# Patient Record
Sex: Female | Born: 1980 | Race: Black or African American | Hispanic: No | Marital: Married | Smoking: Former smoker
Health system: Southern US, Community
[De-identification: ages and names within clinical notes are randomized; demographics above are authoritative.]

## PROBLEM LIST (undated history)

## (undated) DIAGNOSIS — F419 Anxiety disorder, unspecified: Secondary | ICD-10-CM

## (undated) DIAGNOSIS — N939 Abnormal uterine and vaginal bleeding, unspecified: Secondary | ICD-10-CM

## (undated) DIAGNOSIS — K219 Gastro-esophageal reflux disease without esophagitis: Secondary | ICD-10-CM

## (undated) DIAGNOSIS — E669 Obesity, unspecified: Secondary | ICD-10-CM

## (undated) DIAGNOSIS — E282 Polycystic ovarian syndrome: Secondary | ICD-10-CM

## (undated) HISTORY — DX: Anxiety disorder, unspecified: F41.9

## (undated) HISTORY — PX: DILATION AND CURETTAGE OF UTERUS: SHX78

## (undated) HISTORY — DX: Obesity, unspecified: E66.9

---

## 2007-09-08 ENCOUNTER — Emergency Department (HOSPITAL_COMMUNITY): Admission: EM | Admit: 2007-09-08 | Discharge: 2007-09-08 | Payer: Self-pay | Admitting: Emergency Medicine

## 2010-01-16 ENCOUNTER — Ambulatory Visit (HOSPITAL_COMMUNITY): Admission: RE | Admit: 2010-01-16 | Discharge: 2010-01-16 | Payer: Self-pay | Admitting: Obstetrics & Gynecology

## 2010-09-22 LAB — ELECTROLYTE PANEL
Potassium: 3.3 mEq/L — ABNORMAL LOW (ref 3.5–5.1)
Sodium: 139 mEq/L (ref 135–145)

## 2010-09-22 LAB — CBC
HCT: 32.4 % — ABNORMAL LOW (ref 36.0–46.0)
MCHC: 31.2 g/dL (ref 30.0–36.0)
Platelets: 380 10*3/uL (ref 150–400)
RDW: 15.4 % (ref 11.5–15.5)
WBC: 8.3 10*3/uL (ref 4.0–10.5)

## 2010-09-22 LAB — PREGNANCY, URINE: Preg Test, Ur: NEGATIVE

## 2014-10-13 ENCOUNTER — Other Ambulatory Visit: Payer: Self-pay | Admitting: Obstetrics and Gynecology

## 2014-10-16 LAB — CYTOLOGY - PAP

## 2015-11-08 ENCOUNTER — Emergency Department (HOSPITAL_COMMUNITY): Payer: BLUE CROSS/BLUE SHIELD

## 2015-11-08 ENCOUNTER — Emergency Department (HOSPITAL_COMMUNITY)
Admission: EM | Admit: 2015-11-08 | Discharge: 2015-11-08 | Disposition: A | Discharge status: Home / Self Care | Payer: BLUE CROSS/BLUE SHIELD | Attending: Emergency Medicine | Admitting: Emergency Medicine

## 2015-11-08 ENCOUNTER — Encounter (HOSPITAL_COMMUNITY): Payer: Self-pay

## 2015-11-08 DIAGNOSIS — R079 Chest pain, unspecified: Secondary | ICD-10-CM | POA: Diagnosis present

## 2015-11-08 DIAGNOSIS — Z79899 Other long term (current) drug therapy: Secondary | ICD-10-CM | POA: Insufficient documentation

## 2015-11-08 DIAGNOSIS — R0781 Pleurodynia: Secondary | ICD-10-CM

## 2015-11-08 DIAGNOSIS — Z7984 Long term (current) use of oral hypoglycemic drugs: Secondary | ICD-10-CM | POA: Diagnosis not present

## 2015-11-08 LAB — I-STAT CHEM 8, ED
BUN: 16 mg/dL (ref 6–20)
CALCIUM ION: 1.2 mmol/L (ref 1.12–1.23)
CHLORIDE: 102 mmol/L (ref 101–111)
Creatinine, Ser: 0.7 mg/dL (ref 0.44–1.00)
Glucose, Bld: 81 mg/dL (ref 65–99)
HCT: 39 % (ref 36.0–46.0)
HEMOGLOBIN: 13.3 g/dL (ref 12.0–15.0)
Potassium: 3.6 mmol/L (ref 3.5–5.1)
SODIUM: 142 mmol/L (ref 135–145)
TCO2: 27 mmol/L (ref 0–100)

## 2015-11-08 MED ORDER — IBUPROFEN 400 MG PO TABS: 400.0000 mg | ORAL_TABLET | Freq: Three times a day (TID) | ORAL | Status: AC

## 2015-11-08 MED ORDER — OXYCODONE-ACETAMINOPHEN 5-325 MG PO TABS
2.0000 | ORAL_TABLET | Freq: Once | ORAL | Status: AC
  Administered 2015-11-08: 2 via ORAL
  Filled 2015-11-08: qty 2

## 2015-11-08 MED ORDER — KETOROLAC TROMETHAMINE 60 MG/2ML IM SOLN
60.0000 mg | Freq: Once | INTRAMUSCULAR | Status: AC
  Administered 2015-11-08: 60 mg via INTRAMUSCULAR
  Filled 2015-11-08: qty 2

## 2015-11-08 MED ORDER — OXYCODONE-ACETAMINOPHEN 5-325 MG PO TABS: 1.0000 | ORAL_TABLET | Freq: Three times a day (TID) | ORAL | Status: DC | PRN

## 2015-11-08 NOTE — ED Provider Notes (Signed)
CSN: [id number]     Arrival date & time 11/08/15  1301 History   First MD Initiated Contact with Patient 11/08/15 1309     Chief Complaint  Patient presents with  . Pleurisy     (Consider location/radiation/quality/duration/timing/severity/associated sxs/prior Treatment) Patient is a 35 y.o. female presenting with chest pain.  Chest Pain Pain location:  L chest and R chest Pain quality: sharp   Pain radiates to:  Does not radiate Pain radiates to the back: no   Pain severity:  Mild Onset quality:  Gradual Duration:  5 hours Chronicity:  New Context: breathing   Relieved by:  None tried Worsened by:  Nothing tried Associated symptoms: no cough, no fever and no shortness of breath     History reviewed. No pertinent past medical history. History reviewed. No pertinent past surgical history. No family history on file. Social History  Substance Use Topics  . Smoking status: Never Smoker   . Smokeless tobacco: None  . Alcohol Use: No   OB History    No data available     Review of Systems  Constitutional: Negative for fever and chills.  HENT: Negative for congestion.   Respiratory: Negative for cough and shortness of breath.   Cardiovascular: Positive for chest pain.  Endocrine: Negative for polydipsia and polyuria.  All other systems reviewed and are negative.     Allergies  Hydrocodone; Prednisone; and Rocephin  Home Medications   Prior to Admission medications   Medication Sig Start Date End Date Taking? Authorizing Provider  clomiPHENE (CLOMID) 50 MG tablet Take 50 mg by mouth daily.  10/25/15  Yes Historical Provider, MD  diphenhydramine-acetaminophen (TYLENOL PM) 25-500 MG TABS tablet Take 1 tablet by mouth at bedtime as needed.   Yes Historical Provider, MD  metFORMIN (GLUCOPHAGE-XR) 500 MG 24 hr tablet Take 500 mg by mouth daily with breakfast.  10/25/15  Yes Historical Provider, MD  progesterone (PROMETRIUM) 200 MG capsule Take 200 mg by mouth daily.   10/25/15  Yes Historical Provider, MD  ibuprofen (ADVIL,MOTRIN) 400 MG tablet Take 1 tablet (400 mg total) by mouth 3 (three) times daily. 11/08/15 11/15/15  Marily Memos, MD  oxyCODONE-acetaminophen (PERCOCET) 5-325 MG tablet Take 1 tablet by mouth every 8 (eight) hours as needed for severe pain. 11/08/15   Barbara Cower Arnold Kester, MD   BP 107/61 mmHg  Pulse 64  Temp(Src) 98 F (36.7 C) (Oral)  Resp 18  Ht  (1.676 m)  Wt 215 lb (97.523 kg)  BMI 34.72 kg/m2  SpO2 100%  LMP 11/08/2015 Physical Exam  Constitutional: She is oriented to person, place, and time. She appears well-developed and well-nourished.  HENT:  Head: Normocephalic and atraumatic.  Neck: Normal range of motion.  Cardiovascular: Normal rate and regular rhythm.   Pulmonary/Chest: No stridor. No respiratory distress. She exhibits tenderness (anterior right, middle, parasternal and anterior left ).  Abdominal: She exhibits no distension.  Neurological: She is alert and oriented to person, place, and time. No cranial nerve deficit. Coordination normal.  Skin: Skin is warm and dry. No rash noted. No pallor.  Nursing note and vitals reviewed.   ED Course  Procedures (including critical care time) Labs Review Labs Reviewed  I-STAT CHEM 8, ED    Imaging Review Dg Chest 2 View  11/08/2015  CLINICAL DATA:  Generalized pleuritic chest pain EXAM: CHEST  2 VIEW COMPARISON:  None. FINDINGS: Normal heart size. Normal mediastinal contour. No pneumothorax. No pleural effusion. Lungs appear clear, with no acute  consolidative airspace disease and no pulmonary edema. IMPRESSION: No active cardiopulmonary disease. Electronically Signed   By: Delbert PhenixJason A Poff M.D.   On: 11/08/2015 14:40   I have personally reviewed and evaluated these images and lab results as part of my medical decision-making.   EKG Interpretation   Date/Time:  Thursday Nov 08 2015 13:12:31 EDT Ventricular Rate:  76 PR Interval:  132 QRS Duration: 85 QT Interval:  372 QTC  Calculation: 418 R Axis:   52 Text Interpretation:  Sinus rhythm Confirmed by Linell Meldrum MD, Barbara CowerJASON [phone]([id number])  on 11/08/2015 1:34:37 PM      MDM   Final diagnoses:  Pleuritic pain    Pleuritic pain likely 2/2 muscular ttp. cxr and ecg ok. PERC negative. Had been carrying her babies more per her husband which could be related. Will dc on NSAIDs and will take percocet PRN for severe pain.     Marily MemosJason Cindel Daugherty, MD 11/08/15 2103

## 2015-11-08 NOTE — ED Notes (Signed)
Patient states generalized chest pain with deep breathing. Patient denies NV, and denies radiation of pain.  Pain started this morning and increased PTA.

## 2015-11-08 NOTE — ED Notes (Signed)
Patient verbalizes understanding of discharge instructions, prescriptions, home care and follow up care. Patient out of department at this time. 

## 2016-08-14 LAB — OB RESULTS CONSOLE HEPATITIS B SURFACE ANTIGEN: Hepatitis B Surface Ag: NEGATIVE

## 2016-08-14 LAB — OB RESULTS CONSOLE GC/CHLAMYDIA
CHLAMYDIA, DNA PROBE: NEGATIVE
GC PROBE AMP, GENITAL: NEGATIVE

## 2016-08-14 LAB — OB RESULTS CONSOLE HIV ANTIBODY (ROUTINE TESTING): HIV: NONREACTIVE

## 2016-08-14 LAB — OB RESULTS CONSOLE RUBELLA ANTIBODY, IGM: RUBELLA: IMMUNE

## 2016-08-14 LAB — OB RESULTS CONSOLE RPR: RPR: NONREACTIVE

## 2017-01-10 ENCOUNTER — Inpatient Hospital Stay (HOSPITAL_COMMUNITY)
Admission: AD | Admit: 2017-01-10 | Discharge: 2017-01-10 | Disposition: A | Discharge status: Home / Self Care | Payer: BLUE CROSS/BLUE SHIELD | Source: Ambulatory Visit | Attending: Obstetrics and Gynecology | Admitting: Obstetrics and Gynecology

## 2017-01-10 ENCOUNTER — Encounter (HOSPITAL_COMMUNITY): Payer: Self-pay

## 2017-01-10 DIAGNOSIS — Z3A31 31 weeks gestation of pregnancy: Secondary | ICD-10-CM

## 2017-01-10 DIAGNOSIS — N898 Other specified noninflammatory disorders of vagina: Secondary | ICD-10-CM

## 2017-01-10 DIAGNOSIS — O26893 Other specified pregnancy related conditions, third trimester: Principal | ICD-10-CM

## 2017-01-10 DIAGNOSIS — O42913 Preterm premature rupture of membranes, unspecified as to length of time between rupture and onset of labor, third trimester: Secondary | ICD-10-CM | POA: Diagnosis not present

## 2017-01-10 LAB — URINALYSIS, ROUTINE W REFLEX MICROSCOPIC
BILIRUBIN URINE: NEGATIVE
Glucose, UA: NEGATIVE mg/dL
Hgb urine dipstick: NEGATIVE
KETONES UR: 20 mg/dL — AB
Leukocytes, UA: NEGATIVE
Nitrite: NEGATIVE
PH: 6 (ref 5.0–8.0)
Protein, ur: 30 mg/dL — AB
SPECIFIC GRAVITY, URINE: 1.02 (ref 1.005–1.030)

## 2017-01-10 LAB — POCT FERN TEST: POCT FERN TEST: NEGATIVE

## 2017-01-10 NOTE — MAU Provider Note (Signed)
History     Chief Complaint  Patient presents with  . Rupture of Membranes  36 yo G1P0 MBF FET preg @ 31 1/[redacted] weeks gestation presents with c/o leaking fluid on and off since 6 am. No further sx for several hours now. (+) FM no ctx PNC complicated by shortened cervix. Pt is on prometrium   OB History    Gravida Para Term Preterm AB Living   1             SAB TAB Ectopic Multiple Live Births                  No past medical history on file.  No past surgical history on file.  No family history on file.  Social History  Substance Use Topics  . Smoking status: Never Smoker  . Smokeless tobacco: Not on file  . Alcohol use No    Allergies:  Allergies  Allergen Reactions  . Hydrocodone Hives  . Prednisone Nausea And Vomiting  . Rocephin [Ceftriaxone] Hives    Prescriptions Prior to Admission  Medication Sig Dispense Refill Last Dose  . acetaminophen (TYLENOL) 325 MG tablet Take 325 mg by mouth every 6 (six) hours as needed for mild pain.   Past Week at Unknown time  . calcium carbonate (TUMS - DOSED IN MG ELEMENTAL CALCIUM) 500 MG chewable tablet Chew 1-2 tablets by mouth daily.   Past Week at Unknown time  . Prenatal Vit-Fe Fumarate-FA (PRENATAL MULTIVITAMIN) TABS tablet Take 1 tablet by mouth daily at 12 noon.   01/09/2017 at Unknown time  . progesterone (PROMETRIUM) 200 MG capsule Take 200 mg by mouth daily.    01/09/2017 at Unknown time     Physical Exam   Blood pressure 132/71, pulse (!) 104, temperature 98.4 F (36.9 C), temperature source Oral, resp. rate 20.  General appearance: alert, cooperative and no distress Abdomen: gravid nontender Pelvic: cervix normal in appearance, external genitalia normal and vagina: SSE creamy white d/c . no fluid with coughing. fern neg x 2. cervix closed/firm   Tracing: baseline 130 (+) accel to 150 no ctx or decel ED Course  IMP: vaginal discharge IUP @ 31 1/7 weeks P) d/c home. To call if sx  returns MDM   Quinnten Calvin A, MD 1:10 PM 01/10/2017

## 2017-01-10 NOTE — Progress Notes (Addendum)
G1 @ 31.1 wlksga w/EDD of 9/7. Presents to triage for r/o ROM @ 0700. Clear. U/A and fern test ordered. Noted hgih BP. Serial BP initiated.   Hx of shorten cervix and on bedrest. NO cerclage.   1124: EFM applied.   Fern result: negative  1147: Dr. Cherly Hensenousins paged.  1206: paged Dr. Cherly Hensenousins. Returned page, report status of pt given. Will be in unit to assess pt.   1259: Provider at bs for speculum exam. [facility]. Made aware of high Bps with few normal.   1315: Discharge instructions given with pt understanding. Pt left unit via ambulatory with SO.

## 2017-01-11 ENCOUNTER — Encounter (HOSPITAL_COMMUNITY): Payer: Self-pay | Admitting: Anesthesiology

## 2017-01-11 ENCOUNTER — Encounter (HOSPITAL_COMMUNITY): Payer: Self-pay | Admitting: *Deleted

## 2017-01-11 ENCOUNTER — Inpatient Hospital Stay (HOSPITAL_COMMUNITY)
Admission: AD | Admit: 2017-01-11 | Discharge: 2017-01-29 | DRG: 765 | Disposition: A | Discharge status: Home / Self Care | Payer: BLUE CROSS/BLUE SHIELD | Source: Ambulatory Visit | Attending: Obstetrics and Gynecology | Admitting: Obstetrics and Gynecology

## 2017-01-11 ENCOUNTER — Inpatient Hospital Stay (HOSPITAL_COMMUNITY): Payer: BLUE CROSS/BLUE SHIELD

## 2017-01-11 DIAGNOSIS — Z6841 Body Mass Index (BMI) 40.0 and over, adult: Secondary | ICD-10-CM

## 2017-01-11 DIAGNOSIS — O42913 Preterm premature rupture of membranes, unspecified as to length of time between rupture and onset of labor, third trimester: Principal | ICD-10-CM | POA: Diagnosis present

## 2017-01-11 DIAGNOSIS — O26873 Cervical shortening, third trimester: Secondary | ICD-10-CM | POA: Diagnosis present

## 2017-01-11 DIAGNOSIS — Z87891 Personal history of nicotine dependence: Secondary | ICD-10-CM

## 2017-01-11 DIAGNOSIS — O9962 Diseases of the digestive system complicating childbirth: Secondary | ICD-10-CM | POA: Diagnosis present

## 2017-01-11 DIAGNOSIS — O9081 Anemia of the puerperium: Secondary | ICD-10-CM | POA: Diagnosis not present

## 2017-01-11 DIAGNOSIS — Z3A31 31 weeks gestation of pregnancy: Secondary | ICD-10-CM | POA: Diagnosis not present

## 2017-01-11 DIAGNOSIS — O99824 Streptococcus B carrier state complicating childbirth: Secondary | ICD-10-CM | POA: Diagnosis present

## 2017-01-11 DIAGNOSIS — D62 Acute posthemorrhagic anemia: Secondary | ICD-10-CM | POA: Diagnosis not present

## 2017-01-11 DIAGNOSIS — Z98891 History of uterine scar from previous surgery: Secondary | ICD-10-CM

## 2017-01-11 DIAGNOSIS — K219 Gastro-esophageal reflux disease without esophagitis: Secondary | ICD-10-CM | POA: Diagnosis present

## 2017-01-11 DIAGNOSIS — O4290 Premature rupture of membranes, unspecified as to length of time between rupture and onset of labor, unspecified weeks of gestation: Secondary | ICD-10-CM

## 2017-01-11 DIAGNOSIS — O99214 Obesity complicating childbirth: Secondary | ICD-10-CM | POA: Diagnosis present

## 2017-01-11 DIAGNOSIS — O368390 Maternal care for abnormalities of the fetal heart rate or rhythm, unspecified trimester, not applicable or unspecified: Secondary | ICD-10-CM

## 2017-01-11 HISTORY — DX: Polycystic ovarian syndrome: E28.2

## 2017-01-11 HISTORY — DX: Abnormal uterine and vaginal bleeding, unspecified: N93.9

## 2017-01-11 LAB — URINALYSIS, ROUTINE W REFLEX MICROSCOPIC
BILIRUBIN URINE: NEGATIVE
GLUCOSE, UA: NEGATIVE mg/dL
HGB URINE DIPSTICK: NEGATIVE
Ketones, ur: NEGATIVE mg/dL
Leukocytes, UA: NEGATIVE
Nitrite: NEGATIVE
PH: 6 (ref 5.0–8.0)
Protein, ur: NEGATIVE mg/dL
SPECIFIC GRAVITY, URINE: 1.018 (ref 1.005–1.030)

## 2017-01-11 LAB — TYPE AND SCREEN
ABO/RH(D): O POS
ANTIBODY SCREEN: NEGATIVE

## 2017-01-11 LAB — CBC
HCT: 31.5 % — ABNORMAL LOW (ref 36.0–46.0)
HEMOGLOBIN: 10.1 g/dL — AB (ref 12.0–15.0)
MCH: 22.4 pg — ABNORMAL LOW (ref 26.0–34.0)
MCHC: 32.1 g/dL (ref 30.0–36.0)
MCV: 70 fL — ABNORMAL LOW (ref 78.0–100.0)
Platelets: 274 10*3/uL (ref 150–400)
RBC: 4.5 MIL/uL (ref 3.87–5.11)
RDW: 15.8 % — ABNORMAL HIGH (ref 11.5–15.5)
WBC: 10.4 10*3/uL (ref 4.0–10.5)

## 2017-01-11 LAB — ABO/RH: ABO/RH(D): O POS

## 2017-01-11 LAB — AMNISURE RUPTURE OF MEMBRANE (ROM) NOT AT ARMC: AMNISURE: POSITIVE

## 2017-01-11 MED ORDER — LACTATED RINGERS IV SOLN
INTRAVENOUS | Status: DC
  Administered 2017-01-11: 17:00:00 via INTRAVENOUS

## 2017-01-11 MED ORDER — AMOXICILLIN 500 MG PO CAPS
500.0000 mg | ORAL_CAPSULE | Freq: Three times a day (TID) | ORAL | Status: AC
  Administered 2017-01-13 – 2017-01-18 (×15): 500 mg via ORAL
  Filled 2017-01-11 (×15): qty 1

## 2017-01-11 MED ORDER — CALCIUM CARBONATE ANTACID 500 MG PO CHEW
2.0000 | CHEWABLE_TABLET | ORAL | Status: DC | PRN
  Administered 2017-01-11 – 2017-01-16 (×9): 400 mg via ORAL
  Filled 2017-01-11 (×10): qty 2

## 2017-01-11 MED ORDER — BETAMETHASONE SOD PHOS & ACET 6 (3-3) MG/ML IJ SUSP
12.0000 mg | INTRAMUSCULAR | Status: AC
  Administered 2017-01-11 – 2017-01-12 (×2): 12 mg via INTRAMUSCULAR
  Filled 2017-01-11 (×2): qty 2

## 2017-01-11 MED ORDER — MAGNESIUM SULFATE BOLUS VIA INFUSION
4.0000 g | Freq: Once | INTRAVENOUS | Status: AC
  Administered 2017-01-11: 4 g via INTRAVENOUS
  Filled 2017-01-11: qty 500

## 2017-01-11 MED ORDER — PRENATAL MULTIVITAMIN CH
1.0000 | ORAL_TABLET | Freq: Every day | ORAL | Status: DC
  Administered 2017-01-11 – 2017-01-26 (×16): 1 via ORAL
  Filled 2017-01-11 (×18): qty 1

## 2017-01-11 MED ORDER — AZITHROMYCIN 250 MG PO TABS
500.0000 mg | ORAL_TABLET | Freq: Every day | ORAL | Status: AC
  Administered 2017-01-11 – 2017-01-17 (×7): 500 mg via ORAL
  Filled 2017-01-11 (×9): qty 2

## 2017-01-11 MED ORDER — ZOLPIDEM TARTRATE 5 MG PO TABS
5.0000 mg | ORAL_TABLET | Freq: Every evening | ORAL | Status: DC | PRN
  Administered 2017-01-15 – 2017-01-25 (×11): 5 mg via ORAL
  Filled 2017-01-11 (×11): qty 1

## 2017-01-11 MED ORDER — ACETAMINOPHEN 325 MG PO TABS
650.0000 mg | ORAL_TABLET | ORAL | Status: DC | PRN
  Administered 2017-01-12 – 2017-01-23 (×4): 650 mg via ORAL
  Filled 2017-01-11 (×5): qty 2

## 2017-01-11 MED ORDER — SODIUM CHLORIDE 0.9 % IV SOLN
2.0000 g | Freq: Four times a day (QID) | INTRAVENOUS | Status: AC
  Administered 2017-01-11 – 2017-01-13 (×8): 2 g via INTRAVENOUS
  Filled 2017-01-11 (×8): qty 2000

## 2017-01-11 MED ORDER — MAGNESIUM SULFATE 40 G IN LACTATED RINGERS - SIMPLE
2.0000 g/h | INTRAVENOUS | Status: DC
  Filled 2017-01-11: qty 500

## 2017-01-11 MED ORDER — HYDROXYPROGESTERONE CAPROATE 250 MG/ML IM OIL
250.0000 mg | TOPICAL_OIL | INTRAMUSCULAR | Status: DC
  Administered 2017-01-11 – 2017-01-25 (×3): 250 mg via INTRAMUSCULAR
  Filled 2017-01-11 (×3): qty 1

## 2017-01-11 MED ORDER — DOCUSATE SODIUM 100 MG PO CAPS
100.0000 mg | ORAL_CAPSULE | Freq: Every day | ORAL | Status: DC
  Administered 2017-01-11 – 2017-01-25 (×14): 100 mg via ORAL
  Filled 2017-01-11 (×16): qty 1

## 2017-01-11 NOTE — MAU Note (Signed)
Patient was seen in MAU yesterday for leaking of fluid and sent home after negative tests.  She presents again today with continued leaking and some lower left abdominal pain that comes and goes.  Denies vaginal bleeding.  Reports good fetal movement.

## 2017-01-11 NOTE — H&P (Signed)
Nina Sanchez is a 36 y.o. female presenting @ 31 2/7 weeks with PPROM. Pt was seen yesterday for same complaint and had fern x 2 neg. amni sure done today was positive. PNC complicated by shortened cervix for which the pt was placed on oral Prometrium. Last sono 6/27 3lb 12 oz. (+) FM no ctx OB History    Gravida Para Term Preterm AB Living   1             SAB TAB Ectopic Multiple Live Births                 Past Medical History:  Diagnosis Date  . Abnormal vaginal bleeding   . PCOS (polycystic ovarian syndrome)    Past Surgical History:  Procedure Laterality Date  . DILATION AND CURETTAGE OF UTERUS     Family History: family history is not on file. Social History:  reports that she has quit smoking. Her smoking use included Cigarettes. She has never used smokeless tobacco. She reports that she does not drink alcohol or use drugs.     Maternal Diabetes: No Genetic Screening: Normal Maternal Ultrasounds/Referrals: Normal Fetal Ultrasounds or other Referrals:  Fetal echo nl Maternal Substance Abuse:  No Significant Maternal Medications:  Meds include: Other: prometrium Significant Maternal Lab Results:  None Other Comments:  shortened cervix, FET  ROS neg History   Blood pressure 137/65, pulse (!) 104, temperature 97.9 F (36.6 C), temperature source Oral, resp. rate 18, SpO2 99 %. Exam Physical Exam  Constitutional: She appears well-developed and well-nourished.  HENT:  Head: Atraumatic.  Eyes: EOM are normal.  Neck: Neck supple.  GI:  Gravid soft nontender  Musculoskeletal: She exhibits edema.  Neurological: She is alert.  Skin: Skin is warm and dry.   VE: SSE clear fluid. Cervix visually closed  Digital exam deferred. Prior exam yesterday unchanged Prenatal labs: ABO, Rh:  O positive Antibody:  neg Rubella:  Immune RPR:   NR HBsAg:   neg HIV:   neg GBS:   pending  Assessment/Plan: PPROM IUP@ 31 2/7 week P) admit routine labs. IV  Amp/azithromycin( PPROM antibiotics). Magnesium for neuro prophylaxis, GBS cx sono for AFI. Cont monitor. D/c Prometrium. Start 17 OHP, NICU consult  Nina Sanchez A 01/11/2017, 2:39 PM

## 2017-01-12 LAB — CULTURE, BETA STREP (GROUP B ONLY)

## 2017-01-12 LAB — GLUCOSE, CAPILLARY: Glucose-Capillary: 82 mg/dL (ref 65–99)

## 2017-01-12 LAB — OB RESULTS CONSOLE GBS: STREP GROUP B AG: POSITIVE

## 2017-01-12 MED ORDER — MAGNESIUM SULFATE 40 G IN LACTATED RINGERS - SIMPLE
2.0000 g/h | INTRAVENOUS | Status: DC
  Administered 2017-01-12: 2 g/h via INTRAVENOUS
  Filled 2017-01-12: qty 40

## 2017-01-12 NOTE — Progress Notes (Signed)
HD #2 31 3/7 weeks  S; active fetus Denies any further leakage  O: BP 123/66 (BP Location: Left Arm)   Pulse (!) 111   Temp 97.8 F (36.6 C) (Oral)   Resp 18   Ht 5\' 6"  (1.676 m)   Wt 118.8 kg (262 lb)   SpO2 100%   BMI 42.29 kg/m  Lungs clear to A Cor RRR  abdomen: gravid non tender Pelvic deferred Ext no erythema or exudate  Tracing: baseline 130 (+) accel   No ctx  GBS cx (+)  IMP: PPROM on IV antibiotics( Amp/Azithro).  On Magnesium sulfate for neuro prophylaxis IUP @ 31  3/7 weeks. S/P BMZ #1 GBS cx (+) P) cont present mgmt . Await nicu consult

## 2017-01-12 NOTE — Consult Note (Signed)
Neonatology Consult  Note:  At the request of the patients obstetrician Dr. Garwin Brothers I met with Nina Sanchez who is 31 3 weeks currently with pregnancy complicated by PPROM on 7/8 (at 23 2 weeks) and short cervix.  She received betamethasone 7/8-7/9 and is being managed with  IV antibiotics ( Amp/Azithro) for PPROM and 17 OHP for short cervix.  On Magnesium sulfate for neuro prophylaxis.   We reviewed initial delivery room management, including CPAP, [place], and low but certainly possible need for intubation for surfactant administration.  We discussed feeding immaturity and need for full po intake with multiple days of good weight gain and no apnea or bradycardia before discharge.  We reviewed increased risk of jaundice, infection, and temperature instability.    Discussed likely length of stay.  Thank you for allowing [place] to participate in her care.  Please call with questions.  Higinio Roger, DO  Neonatologist   The total length of face-to-face or floor / unit time for this encounter was 20 minutes.  Counseling and / or coordination of care was greater than fifty percent of the time.

## 2017-01-13 NOTE — Progress Notes (Signed)
Pt called out and said she was leaking more clear fluid. Pt denies pain and reports good fetal movement. Educated pt that she may continue to leak fluids since she is ruptured. Informed her that she needs to call out if she begins to have abdominal or back pain.

## 2017-01-13 NOTE — Progress Notes (Addendum)
HD #3 31 4/7 weeks  S; active fetus (+)leakage today  O: BP (!) 143/71 (BP Location: Right Arm)   Pulse 97   Temp 98.6 F (37 C) (Oral)   Resp 20   Ht 5\' 6"  (1.676 m)   Wt 120.4 kg (265 lb 8 oz)   SpO2 100%   BMI 42.85 kg/m    Lungs clear to A Cor RRR  abdomen: gravid non tender Pelvic deferred Ext no erythema or exudate  Tracing: baseline 130 (+) accel  140-145 No ctx   IMP: PPROM completed IV antibiotics( Amp/Azithro) started on amoxyl and azithromycin orally.  Magnesium sulfate for neuro prophylaxis completed IUP @ 31  4/7 weeks. S/P BMZ #2 GBS cx (+) P) cont inpt mgmt. Monitor( NSt q shift

## 2017-01-14 LAB — TYPE AND SCREEN
ABO/RH(D): O POS
ANTIBODY SCREEN: NEGATIVE

## 2017-01-14 NOTE — Progress Notes (Signed)
HD #4 31 5/7 weeks  S; still leaking. No ctx (+) FM Did nicu tour yesterday. No questions  O: BP 134/70 (BP Location: Right Arm)   Pulse 97   Temp 98.3 F (36.8 C) (Oral)   Resp 18   Ht 5\' 6"  (1.676 m)   Wt 119.9 kg (264 lb 4 oz)   SpO2 99%   BMI 42.65 kg/m     Lungs clear to A Cor RRR  abdomen: gravid non tender Pelvic deferred Ext no erythema or exudate  Tracing: baseline 130 (+) accel  140-145 No ctx   IMP: PPROM completed IV antibiotics( Amp/Azithro) started on amoxyl and azithromycin orally D2.  Magnesium sulfate for neuro prophylaxis completed IUP @ 31  5/7 weeks.  BMZ complete GBS cx (+) P) cont inpt mgmt.

## 2017-01-15 ENCOUNTER — Encounter (HOSPITAL_COMMUNITY): Payer: Self-pay

## 2017-01-15 NOTE — Progress Notes (Signed)
HD #5 31 6/7 weeks  S; no leaking fluid today. No ctx (+) FM   O: BP 128/63 (BP Location: Left Arm)   Pulse 95   Temp 98.7 F (37.1 C) (Oral)   Resp 18   Ht 5\' 6"  (1.676 m)   Wt 117.3 kg (258 lb 8 oz)   SpO2 98%   BMI 41.72 kg/m      Lungs clear to A Cor RRR  abdomen: gravid non tender Pelvic deferred Ext no erythema or exudate  Tracing: baseline 135 (+) accel 160 No ctx   IMP: PPROM  on amoxyl and azithromycin orally D3.   S/P Magnesium sulfate, BMZ  IUP @ 31  6/7 weeks.  GBS cx (+) P) cont inpt mgmt.

## 2017-01-15 NOTE — Progress Notes (Signed)
Family accompanied pt outside.

## 2017-01-16 NOTE — Progress Notes (Signed)
HD #6 32 weeks  S; no leaking fluid today. No ctx (+) FM   O: VSS afebrile Lungs clear to A Cor RRR  abdomen: gravid non tender Pelvic deferred Ext no erythema or exudate   IMP: PPROM  on amoxyl and azithromycin orally D4.   S/P Magnesium sulfate, BMZ  IUP @ 32 weeks.  GBS cx (+) P) cont inpt mgmt.

## 2017-01-17 LAB — TYPE AND SCREEN
ABO/RH(D): O POS
Antibody Screen: NEGATIVE

## 2017-01-17 MED ORDER — PANTOPRAZOLE SODIUM 40 MG PO TBEC
40.0000 mg | DELAYED_RELEASE_TABLET | Freq: Every day | ORAL | Status: DC
  Administered 2017-01-17 – 2017-01-18 (×3): 40 mg via ORAL
  Filled 2017-01-17 (×3): qty 1

## 2017-01-17 NOTE — Progress Notes (Signed)
HD #7 32 1/7 weeks  S;  No ctx (+) FM. Request med for heartburn as tums not working. Clear AF   O: BP (!) 156/75   Pulse 97   Temp 98 F (36.7 C) (Oral)   Resp 18   Ht 5\' 6"  (1.676 m)   Wt 115.2 kg (254 lb 0.6 oz)   SpO2 100%   BMI 41.00 kg/m   Lungs clear to A Cor RRR  abdomen: gravid non tender Pelvic deferred Ext no erythema or tenderness  Tracing: baseline 130 (+) accel to 160 No ctx   IMP: PPROM  on amoxyl and azithromycin orally D5.   S/P Magnesium sulfate, BMZ  IUP @ 32 1/7 GBS cx (+) P) cont inpt mgmt., change to daily NST. Start protonix

## 2017-01-18 NOTE — Progress Notes (Addendum)
HD #8 32 2/7 weeks  S;  No complaints No leakage. (+) FM  O: BP 125/64 (BP Location: Right Arm)   Pulse (!) 102   Temp 98.3 F (36.8 C) (Oral)   Resp 16   Ht 5\' 6"  (1.676 m)   Wt 115.4 kg (254 lb 8 oz)   SpO2 100%   BMI 41.08 kg/m    Lungs clear to A Cor RRR  abdomen: gravid non tender Pelvic deferred Ext no erythema or tenderness  Tracing: baseline 130 (+) accel to 160 No ctx   IMP: PPROM  S/P Magnesium sulfate, BMZ . PPROM Antibiotics complete. No evidence of labor or chorio IUP @ 32 2/7 GBS cx (+) GERD on protonix P) cont inpt mgmt

## 2017-01-19 MED ORDER — PANTOPRAZOLE SODIUM 40 MG PO TBEC
40.0000 mg | DELAYED_RELEASE_TABLET | Freq: Two times a day (BID) | ORAL | Status: DC
  Administered 2017-01-19 – 2017-01-26 (×15): 40 mg via ORAL
  Filled 2017-01-19 (×15): qty 1

## 2017-01-19 NOTE — Progress Notes (Addendum)
HD #9 32 3/7 weeks  S;  No complaints No leakage. (+) FM ? If another sono for weight  O: BP 128/64 P100 T 98.7    Lungs clear to A Cor RRR  abdomen: gravid non tender Pelvic deferred Ext no erythema or tenderness     IMP: PPROM  S/P Magnesium sulfate, BMZ . PPROM Antibiotics complete. No evidence of labor or chorio IUP @ 32 3/7 GBS cx (+) GERD on protonix( increase to bid). No indication for sono for growth( last one done 6/27) P) cont inpt mgmt

## 2017-01-19 NOTE — Progress Notes (Signed)
CSW met with patient in room 309 at the request of patient.  CSW inquired about patient's needs and patient requested information regarding adoption process (patient wants to adopt a baby).  CSW provided patient with information regarding becoming an adopting parent and provided patient with local adoption agencies. Patient was receptive of information and thanked CSW for meeting with patient.   Laurey Arrow, MSW, LCSW Clinical Social Work [phone]

## 2017-01-19 NOTE — Progress Notes (Signed)
Initial Nutrition Assessment  DOCUMENTATION CODES:  Obesity unspecified  INTERVENTION:  Regular diet May order double protein portions, snacks TID and from retail  NUTRITION DIAGNOSIS:  Increased nutrient needs related to  (pregnancy and fetal growth rerquirements) as evidenced by  (32 weeks IUP).  GOAL:  Patient will meet greater than or equal to 90% of their needs MONITOR:  Weight trends  REASON FOR ASSESSMENT:  Antenatal   ASSESSMENT:  32 3/7 weeks, PROM. pregravid wt 250 lbs, weight gain 6 lbs. Appetite is excellent. Delivery planned at 34 weeks per pt   Diet Order:  Diet regular Room service appropriate? Yes; Fluid consistency: Thin Skin:  Reviewed, no issues  Height:   Ht Readings from Last 1 Encounters:  01/11/17 5\' 6"  (1.676 m)   Weight:   Wt Readings from Last 1 Encounters:  01/19/17 256 lb 12 oz (116.5 kg)    Ideal Body Weight:    130 lbs  BMI:  Body mass index is 41.44 kg/m.  Estimated Nutritional Needs:   Kcal:  2200-2400  Protein:  90-100 g  Fluid:  2.5 L  EDUCATION NEEDS:   No education needs identified at this time  Nina Sanchez M.Odis LusterEd. R.D. LDN Neonatal Nutrition Support Specialist/RD III Pager [phone]      Phone [phone]

## 2017-01-20 NOTE — Progress Notes (Addendum)
HD #10 32 4/7 weeks  S;  No complaints Active fetus./ denies ctx  O: BP 125/65 (BP Location: Left Arm)   Pulse 99   Temp 98.7 F (37.1 C) (Oral)   Resp 20   Ht 5\' 6"  (1.676 m)   Wt 117.4 kg (258 lb 12 oz)   SpO2 100%   BMI 41.76 kg/m    Lungs clear to A Cor RRR  abdomen: gravid non tender Pelvic deferred Ext no erythema or tenderness  IMP: PPROM  S/P Magnesium sulfate, BMZ . PPROM Antibiotics complete. No evidence of labor or chorio IUP @ 32 4/7 GBS cx (+) GERD controlled P) IOL 34 weeks. Ok for weekly out of room visit. Seen by SW

## 2017-01-21 NOTE — Progress Notes (Addendum)
HD #11 32 5/7 weeks  S;  No complaints Active fetus./ denies ctx leaking clear fluid without color odor  O: BP (!) 120/59 (BP Location: Left Arm)   Pulse (!) 103   Temp 99.1 F (37.3 C) (Oral)   Resp 20   Ht 5\' 6"  (1.676 m)   Wt 117.4 kg (258 lb 12 oz)   SpO2 98%   BMI 41.76 kg/m    Lungs clear to A Cor RRR  abdomen: gravid non tender Pelvic deferred Ext no erythema or tenderness Tracing: baseline 135 (+) accel to 155 no variables/decels or ctx  IMP: PPROM  S/P Magnesium sulfate, BMZ . PPROM Antibiotics complete. No evidence of labor or chorio IUP @ 32 5/7 GBS cx positive GERD controlled P)remain inpt

## 2017-01-21 NOTE — Progress Notes (Signed)
Received call from Cousins MD. MD called to notify nurse that per protocol T and S will not be signed off and will not be completed on this pt as [facility]

## 2017-01-22 NOTE — Progress Notes (Signed)
HD #12  32 6/7 weeks  S;  Had difficulty sleeping. (+) leaking fluid (+) FM. Denies ctx  O: BP 108/68 (BP Location: Right Arm)   Pulse (!) 103   Temp (!) 97.5 F (36.4 C) (Oral) Comment: eating ice  Resp 16   Ht 5\' 6"  (1.676 m)   Wt 117.4 kg (258 lb 12 oz)   SpO2 98%   BMI 41.76 kg/m   Lungs clear to A Cor RRR  abdomen: gravid non tender Pelvic deferred Ext +1 edema   IMP: PPROM  S/P Magnesium sulfate, BMZ . PPROM Antibiotics complete. No evidence of labor or chorio IUP @ 32 6/7 GBS cx positive GERD controlled  P)nst today PTL prec. IOL @ 34 weeks

## 2017-01-23 MED ORDER — BUTALBITAL-APAP-CAFFEINE 50-325-40 MG PO TABS
1.0000 | ORAL_TABLET | Freq: Once | ORAL | Status: AC
  Administered 2017-01-23: 1 via ORAL
  Filled 2017-01-23: qty 1

## 2017-01-23 NOTE — Progress Notes (Addendum)
HD #13 33 weeks  S;  Still leaking fluid (+) active fetus O: BP (!) 113/52   Pulse (!) 101   Temp 98.2 F (36.8 C) (Oral)   Resp 18   Ht 5\' 6"  (1.676 m)   Wt 117.4 kg (258 lb 12 oz)   SpO2 99%   BMI 41.76 kg/m    Lungs clear to A Cor RRR  abdomen: gravid non tender Pelvic deferred Ext +1 edema Tracing: baseline 140 (+) accel occ short variable  IMP: PPROM  S/P Magnesium sulfate, BMZ . PPROM Antibiotics complete. No evidence of labor or chorio IUP @ 33 0/7 GBS cx positive GERD controlled  P). IOL @ 34 weeks. Remain inpt. Defer T&S until time for delivery

## 2017-01-24 MED ORDER — OXYCODONE-ACETAMINOPHEN 5-325 MG PO TABS
1.0000 | ORAL_TABLET | Freq: Four times a day (QID) | ORAL | Status: DC | PRN
  Administered 2017-01-24 (×2): 2 via ORAL
  Administered 2017-01-25 (×2): 1 via ORAL
  Filled 2017-01-24: qty 1
  Filled 2017-01-24 (×2): qty 2
  Filled 2017-01-24: qty 1

## 2017-01-24 NOTE — Progress Notes (Signed)
Pt requested to be placed on EFM due to having more leaking.

## 2017-01-24 NOTE — Progress Notes (Addendum)
HD #14 33 1/7 weeks  S;  Still leaking fluid (+) active fetus has headache not responsive to Fioricet last night. Denies visual changes or light sensitivity O: BP 123/69 (BP Location: Right Arm)   Pulse 94   Temp 98.2 F (36.8 C) (Oral)   Resp 18   Ht 5\' 6"  (1.676 m)   Wt 117.4 kg (258 lb 12 oz)   SpO2 99%   BMI 41.76 kg/m    Lungs clear to A Cor RRR  abdomen: gravid non tender Pelvic deferred Ext +1 edema Tracing: baseline 140 (+) accel occ short variable  IMP: PPROM  S/P Magnesium sulfate, BMZ . PPROM Antibiotics complete. No evidence of labor or chorio IUP @ 33 1/7 GBS cx positive GERD controlled  P). IOL @ 34 weeks. Remain inpt. Defer T&S until time for delivery

## 2017-01-25 LAB — CBC WITH DIFFERENTIAL/PLATELET
Basophils Absolute: 0 10*3/uL (ref 0.0–0.1)
Basophils Relative: 0 %
EOS ABS: 0.3 10*3/uL (ref 0.0–0.7)
EOS PCT: 3 %
HEMATOCRIT: 33.2 % — AB (ref 36.0–46.0)
Hemoglobin: 10.8 g/dL — ABNORMAL LOW (ref 12.0–15.0)
LYMPHS ABS: 2.8 10*3/uL (ref 0.7–4.0)
LYMPHS PCT: 23 %
MCH: 22.7 pg — AB (ref 26.0–34.0)
MCHC: 32.5 g/dL (ref 30.0–36.0)
MCV: 69.9 fL — AB (ref 78.0–100.0)
MONO ABS: 0.3 10*3/uL (ref 0.1–1.0)
MONOS PCT: 3 %
Neutro Abs: 8.6 10*3/uL — ABNORMAL HIGH (ref 1.7–7.7)
Neutrophils Relative %: 71 %
PLATELETS: 284 10*3/uL (ref 150–400)
RBC: 4.75 MIL/uL (ref 3.87–5.11)
RDW: 15.9 % — AB (ref 11.5–15.5)
WBC: 12.1 10*3/uL — AB (ref 4.0–10.5)

## 2017-01-25 LAB — COMPREHENSIVE METABOLIC PANEL
ALBUMIN: 2.8 g/dL — AB (ref 3.5–5.0)
ALT: 17 U/L (ref 14–54)
AST: 20 U/L (ref 15–41)
Alkaline Phosphatase: 75 U/L (ref 38–126)
Anion gap: 8 (ref 5–15)
BILIRUBIN TOTAL: 0.1 mg/dL — AB (ref 0.3–1.2)
BUN: 7 mg/dL (ref 6–20)
CHLORIDE: 104 mmol/L (ref 101–111)
CO2: 23 mmol/L (ref 22–32)
CREATININE: 0.53 mg/dL (ref 0.44–1.00)
Calcium: 8.9 mg/dL (ref 8.9–10.3)
GFR calc Af Amer: >60 mL/min (ref >60)
GFR calc non Af Amer: >60 mL/min (ref >60)
GLUCOSE: 89 mg/dL (ref 65–99)
POTASSIUM: 3.4 mmol/L — AB (ref 3.5–5.1)
Sodium: 135 mmol/L (ref 135–145)
TOTAL PROTEIN: 7.1 g/dL (ref 6.5–8.1)

## 2017-01-25 LAB — PROTEIN / CREATININE RATIO, URINE
Creatinine, Urine: 67 mg/dL
Protein Creatinine Ratio: 0.76 mg/mg{Cre} — ABNORMAL HIGH (ref 0.00–0.15)
TOTAL PROTEIN, URINE: 51 mg/dL

## 2017-01-25 LAB — URIC ACID: Uric Acid, Serum: 4 mg/dL (ref 2.3–6.6)

## 2017-01-25 NOTE — Progress Notes (Signed)
HD #15 33 2/7 weeks  S;   leaking fluid  A lot (+) active fetus notes h/a despite percocet. Denies heartburn or visual changes O: BP 108/73 (BP Location: Right Arm)   Pulse 95   Temp 98 F (36.7 C) (Oral)   Resp 20   Ht 5\' 6"  (1.676 m)   Wt 117.4 kg (258 lb 12 oz)   SpO2 98%   BMI 41.76 kg/m     Breast soft nontender abdomen: gravid non tender Pelvic deferred Ext +1 edema Tracing: baseline 125 (+) accels no ctx  IMP: PPROM  S/P Magnesium sulfate, BMZ . PPROM Antibiotics complete. No evidence of labor or chorio Persistent h/a IUP @ 33 2/7 GBS cx positive GERD controlled with protonix  P). PIH labs today r/o atypical preeclampsia. Weight. inpt status

## 2017-01-26 ENCOUNTER — Inpatient Hospital Stay (HOSPITAL_COMMUNITY): Payer: BLUE CROSS/BLUE SHIELD | Admitting: Anesthesiology

## 2017-01-26 ENCOUNTER — Encounter (HOSPITAL_COMMUNITY): Payer: Self-pay | Admitting: *Deleted

## 2017-01-26 ENCOUNTER — Inpatient Hospital Stay (HOSPITAL_COMMUNITY): Payer: BLUE CROSS/BLUE SHIELD

## 2017-01-26 ENCOUNTER — Encounter (HOSPITAL_COMMUNITY)
Admission: AD | Disposition: A | Discharge status: Home / Self Care | Payer: Self-pay | Source: Ambulatory Visit | Attending: Obstetrics and Gynecology

## 2017-01-26 DIAGNOSIS — Z98891 History of uterine scar from previous surgery: Secondary | ICD-10-CM

## 2017-01-26 LAB — TYPE AND SCREEN
ABO/RH(D): O POS
Antibody Screen: NEGATIVE

## 2017-01-26 SURGERY — Surgical Case
Anesthesia: Spinal

## 2017-01-26 MED ORDER — SODIUM CHLORIDE 0.9 % IR SOLN
Status: DC | PRN
  Administered 2017-01-26: 1

## 2017-01-26 MED ORDER — OXYCODONE HCL 5 MG PO TABS: 5.0000 mg | ORAL_TABLET | Freq: Once | ORAL | Status: DC | PRN

## 2017-01-26 MED ORDER — MENTHOL 3 MG MT LOZG: 1.0000 | LOZENGE | OROMUCOSAL | Status: DC | PRN

## 2017-01-26 MED ORDER — CEFAZOLIN SODIUM-DEXTROSE 2-3 GM-% IV SOLR
INTRAVENOUS | Status: DC | PRN
  Administered 2017-01-26: 2 g via INTRAVENOUS

## 2017-01-26 MED ORDER — FENTANYL CITRATE (PF) 100 MCG/2ML IJ SOLN
INTRAMUSCULAR | Status: DC | PRN
  Administered 2017-01-26: 20 ug via INTRATHECAL

## 2017-01-26 MED ORDER — SIMETHICONE 80 MG PO CHEW: 80.0000 mg | CHEWABLE_TABLET | ORAL | Status: DC | PRN

## 2017-01-26 MED ORDER — DEXAMETHASONE SODIUM PHOSPHATE 4 MG/ML IJ SOLN
INTRAMUSCULAR | Status: DC | PRN
  Administered 2017-01-26: 4 mg via INTRAVENOUS

## 2017-01-26 MED ORDER — ONDANSETRON HCL 4 MG/2ML IJ SOLN: 4.0000 mg | Freq: Four times a day (QID) | INTRAMUSCULAR | Status: DC | PRN

## 2017-01-26 MED ORDER — NALBUPHINE SYRINGE 5 MG/0.5 ML
5.0000 mg | INJECTION | INTRAMUSCULAR | Status: DC | PRN
  Administered 2017-01-27: 5 mg via SUBCUTANEOUS
  Filled 2017-01-26 (×3): qty 0.5

## 2017-01-26 MED ORDER — BUPIVACAINE HCL (PF) 0.25 % IJ SOLN
INTRAMUSCULAR | Status: AC
  Filled 2017-01-26: qty 30

## 2017-01-26 MED ORDER — ONDANSETRON HCL 4 MG/2ML IJ SOLN: 4.0000 mg | Freq: Three times a day (TID) | INTRAMUSCULAR | Status: DC | PRN

## 2017-01-26 MED ORDER — SOD CITRATE-CITRIC ACID 500-334 MG/5ML PO SOLN
ORAL | Status: AC
  Filled 2017-01-26: qty 15

## 2017-01-26 MED ORDER — CEFAZOLIN SODIUM-DEXTROSE 2-4 GM/100ML-% IV SOLN
INTRAVENOUS | Status: AC
  Filled 2017-01-26: qty 100

## 2017-01-26 MED ORDER — SIMETHICONE 80 MG PO CHEW
80.0000 mg | CHEWABLE_TABLET | ORAL | Status: DC
  Administered 2017-01-27 – 2017-01-29 (×3): 80 mg via ORAL
  Filled 2017-01-26 (×3): qty 1

## 2017-01-26 MED ORDER — MEPERIDINE HCL 25 MG/ML IJ SOLN: 6.2500 mg | INTRAMUSCULAR | Status: DC | PRN

## 2017-01-26 MED ORDER — NALBUPHINE SYRINGE 5 MG/0.5 ML
5.0000 mg | INJECTION | INTRAMUSCULAR | Status: DC | PRN
  Filled 2017-01-26: qty 0.5

## 2017-01-26 MED ORDER — NALOXONE HCL 0.4 MG/ML IJ SOLN: 0.4000 mg | INTRAMUSCULAR | Status: DC | PRN

## 2017-01-26 MED ORDER — NALBUPHINE SYRINGE 5 MG/0.5 ML
5.0000 mg | INJECTION | Freq: Once | INTRAMUSCULAR | Status: DC | PRN
  Filled 2017-01-26: qty 0.5

## 2017-01-26 MED ORDER — MORPHINE SULFATE (PF) 0.5 MG/ML IJ SOLN
INTRAMUSCULAR | Status: AC
  Filled 2017-01-26: qty 10

## 2017-01-26 MED ORDER — LACTATED RINGERS IV SOLN: INTRAVENOUS | Status: DC

## 2017-01-26 MED ORDER — OXYCODONE HCL 5 MG PO TABS: 5.0000 mg | ORAL_TABLET | ORAL | Status: DC | PRN

## 2017-01-26 MED ORDER — OXYTOCIN 40 UNITS IN LACTATED RINGERS INFUSION - SIMPLE MED: 2.5000 [IU]/h | INTRAVENOUS | Status: DC

## 2017-01-26 MED ORDER — LACTATED RINGERS IV SOLN
INTRAVENOUS | Status: DC | PRN
  Administered 2017-01-26 (×2): via INTRAVENOUS

## 2017-01-26 MED ORDER — NALOXONE HCL 2 MG/2ML IJ SOSY: 1.0000 ug/kg/h | PREFILLED_SYRINGE | INTRAVENOUS | Status: DC | PRN

## 2017-01-26 MED ORDER — ONDANSETRON HCL 4 MG/2ML IJ SOLN
INTRAMUSCULAR | Status: AC
  Filled 2017-01-26: qty 2

## 2017-01-26 MED ORDER — SENNOSIDES-DOCUSATE SODIUM 8.6-50 MG PO TABS
2.0000 | ORAL_TABLET | ORAL | Status: DC
  Administered 2017-01-27 – 2017-01-29 (×3): 2 via ORAL
  Filled 2017-01-26 (×3): qty 2

## 2017-01-26 MED ORDER — BUPIVACAINE IN DEXTROSE 0.75-8.25 % IT SOLN
INTRATHECAL | Status: DC | PRN
  Administered 2017-01-26: 1.6 mL via INTRATHECAL

## 2017-01-26 MED ORDER — PHENYLEPHRINE 8 MG IN D5W 100 ML (0.08MG/ML) PREMIX OPTIME
INJECTION | INTRAVENOUS | Status: AC
  Filled 2017-01-26: qty 100

## 2017-01-26 MED ORDER — MORPHINE SULFATE (PF) 0.5 MG/ML IJ SOLN
INTRAMUSCULAR | Status: DC | PRN
Start: 2017-01-26 — End: 2017-01-26
  Administered 2017-01-26: .2 mg via INTRATHECAL

## 2017-01-26 MED ORDER — KETOROLAC TROMETHAMINE 30 MG/ML IJ SOLN
30.0000 mg | Freq: Four times a day (QID) | INTRAMUSCULAR | Status: AC | PRN
  Administered 2017-01-26: 30 mg via INTRAMUSCULAR

## 2017-01-26 MED ORDER — KETOROLAC TROMETHAMINE 30 MG/ML IJ SOLN
30.0000 mg | Freq: Four times a day (QID) | INTRAMUSCULAR | Status: AC | PRN
  Administered 2017-01-27: 30 mg via INTRAVENOUS
  Filled 2017-01-26: qty 1

## 2017-01-26 MED ORDER — OXYTOCIN 10 UNIT/ML IJ SOLN
INTRAMUSCULAR | Status: AC
  Filled 2017-01-26: qty 4

## 2017-01-26 MED ORDER — OXYTOCIN 10 UNIT/ML IJ SOLN
INTRAMUSCULAR | Status: DC | PRN
  Administered 2017-01-26: 40 [IU] via INTRAVENOUS

## 2017-01-26 MED ORDER — TETANUS-DIPHTH-ACELL PERTUSSIS 5-2.5-18.5 LF-MCG/0.5 IM SUSP: 0.5000 mL | Freq: Once | INTRAMUSCULAR | Status: DC

## 2017-01-26 MED ORDER — COCONUT OIL OIL: 1.0000 "application " | TOPICAL_OIL | Status: DC | PRN

## 2017-01-26 MED ORDER — PHENYLEPHRINE 8 MG IN D5W 100 ML (0.08MG/ML) PREMIX OPTIME
INJECTION | INTRAVENOUS | Status: DC | PRN
  Administered 2017-01-26: 60 ug/min via INTRAVENOUS

## 2017-01-26 MED ORDER — BUPIVACAINE HCL (PF) 0.25 % IJ SOLN
INTRAMUSCULAR | Status: DC | PRN
  Administered 2017-01-26: 10 mL

## 2017-01-26 MED ORDER — FENTANYL CITRATE (PF) 100 MCG/2ML IJ SOLN: 25.0000 ug | INTRAMUSCULAR | Status: DC | PRN

## 2017-01-26 MED ORDER — PRENATAL MULTIVITAMIN CH
1.0000 | ORAL_TABLET | Freq: Every day | ORAL | Status: DC
  Administered 2017-01-27 – 2017-01-29 (×3): 1 via ORAL
  Filled 2017-01-26 (×3): qty 1

## 2017-01-26 MED ORDER — LACTATED RINGERS IV BOLUS (SEPSIS)
1000.0000 mL | Freq: Once | INTRAVENOUS | Status: AC
  Administered 2017-01-26: 1000 mL via INTRAVENOUS

## 2017-01-26 MED ORDER — KETOROLAC TROMETHAMINE 30 MG/ML IJ SOLN
INTRAMUSCULAR | Status: AC
  Filled 2017-01-26: qty 1

## 2017-01-26 MED ORDER — SCOPOLAMINE 1 MG/3DAYS TD PT72: 1.0000 | MEDICATED_PATCH | Freq: Once | TRANSDERMAL | Status: DC

## 2017-01-26 MED ORDER — FENTANYL CITRATE (PF) 100 MCG/2ML IJ SOLN
INTRAMUSCULAR | Status: AC
  Filled 2017-01-26: qty 2

## 2017-01-26 MED ORDER — ZOLPIDEM TARTRATE 5 MG PO TABS
5.0000 mg | ORAL_TABLET | Freq: Every evening | ORAL | Status: DC | PRN
  Administered 2017-01-27 – 2017-01-29 (×3): 5 mg via ORAL
  Filled 2017-01-26 (×3): qty 1

## 2017-01-26 MED ORDER — DIPHENHYDRAMINE HCL 25 MG PO CAPS
25.0000 mg | ORAL_CAPSULE | Freq: Four times a day (QID) | ORAL | Status: DC | PRN
  Administered 2017-01-27 (×2): 25 mg via ORAL
  Filled 2017-01-26 (×2): qty 1

## 2017-01-26 MED ORDER — DIPHENHYDRAMINE HCL 25 MG PO CAPS
25.0000 mg | ORAL_CAPSULE | ORAL | Status: DC | PRN
  Administered 2017-01-26 – 2017-01-28 (×3): 25 mg via ORAL
  Filled 2017-01-26 (×4): qty 1

## 2017-01-26 MED ORDER — WITCH HAZEL-GLYCERIN EX PADS: 1.0000 "application " | MEDICATED_PAD | CUTANEOUS | Status: DC | PRN

## 2017-01-26 MED ORDER — DIPHENHYDRAMINE HCL 50 MG/ML IJ SOLN
12.5000 mg | INTRAMUSCULAR | Status: DC | PRN
  Administered 2017-01-26: 12.5 mg via INTRAVENOUS

## 2017-01-26 MED ORDER — ONDANSETRON HCL 4 MG/2ML IJ SOLN
INTRAMUSCULAR | Status: DC | PRN
  Administered 2017-01-26: 4 mg via INTRAVENOUS

## 2017-01-26 MED ORDER — OXYCODONE HCL 5 MG/5ML PO SOLN: 5.0000 mg | Freq: Once | ORAL | Status: DC | PRN

## 2017-01-26 MED ORDER — SOD CITRATE-CITRIC ACID 500-334 MG/5ML PO SOLN
ORAL | Status: AC
  Administered 2017-01-26: 30 mL
  Filled 2017-01-26: qty 15

## 2017-01-26 MED ORDER — SIMETHICONE 80 MG PO CHEW
80.0000 mg | CHEWABLE_TABLET | Freq: Three times a day (TID) | ORAL | Status: DC
  Administered 2017-01-27 – 2017-01-29 (×8): 80 mg via ORAL
  Filled 2017-01-26 (×8): qty 1

## 2017-01-26 MED ORDER — FLUCONAZOLE 150 MG PO TABS
150.0000 mg | ORAL_TABLET | Freq: Once | ORAL | Status: AC
  Administered 2017-01-26: 150 mg via ORAL
  Filled 2017-01-26: qty 1

## 2017-01-26 MED ORDER — SODIUM CHLORIDE 0.9% FLUSH: 3.0000 mL | INTRAVENOUS | Status: DC | PRN

## 2017-01-26 MED ORDER — IBUPROFEN 600 MG PO TABS
600.0000 mg | ORAL_TABLET | Freq: Four times a day (QID) | ORAL | Status: DC
  Administered 2017-01-27 – 2017-01-29 (×11): 600 mg via ORAL
  Filled 2017-01-26 (×11): qty 1

## 2017-01-26 MED ORDER — DIPHENHYDRAMINE HCL 50 MG/ML IJ SOLN
INTRAMUSCULAR | Status: AC
  Filled 2017-01-26: qty 1

## 2017-01-26 MED ORDER — OXYCODONE HCL 5 MG PO TABS
10.0000 mg | ORAL_TABLET | ORAL | Status: DC | PRN
  Administered 2017-01-27 – 2017-01-29 (×9): 10 mg via ORAL
  Filled 2017-01-26 (×9): qty 2

## 2017-01-26 MED ORDER — DEXAMETHASONE SODIUM PHOSPHATE 4 MG/ML IJ SOLN
INTRAMUSCULAR | Status: AC
  Filled 2017-01-26: qty 1

## 2017-01-26 MED ORDER — SCOPOLAMINE 1 MG/3DAYS TD PT72
MEDICATED_PATCH | TRANSDERMAL | Status: AC
  Filled 2017-01-26: qty 1

## 2017-01-26 MED ORDER — SCOPOLAMINE 1 MG/3DAYS TD PT72
MEDICATED_PATCH | TRANSDERMAL | Status: DC | PRN
  Administered 2017-01-26: 1 via TRANSDERMAL

## 2017-01-26 MED ORDER — DIBUCAINE 1 % RE OINT: 1.0000 "application " | TOPICAL_OINTMENT | RECTAL | Status: DC | PRN

## 2017-01-26 SURGICAL SUPPLY — 45 items
BARRIER ADHS 3X4 INTERCEED (GAUZE/BANDAGES/DRESSINGS) ×3 IMPLANT
BENZOIN TINCTURE PRP APPL 2/3 (GAUZE/BANDAGES/DRESSINGS) ×3 IMPLANT
CHLORAPREP W/TINT 26ML (MISCELLANEOUS) ×3 IMPLANT
CLAMP CORD UMBIL (MISCELLANEOUS) IMPLANT
CLOSURE STERI STRIP 1/2 X4 (GAUZE/BANDAGES/DRESSINGS) ×3 IMPLANT
CLOSURE WOUND 1/2 X4 (GAUZE/BANDAGES/DRESSINGS)
CLOTH BEACON ORANGE TIMEOUT ST (SAFETY) ×3 IMPLANT
CONTAINER PREFILL 10% NBF 15ML (MISCELLANEOUS) IMPLANT
DRAPE C SECTION CLR SCREEN (DRAPES) ×3 IMPLANT
DRSG OPSITE POSTOP 4X10 (GAUZE/BANDAGES/DRESSINGS) ×3 IMPLANT
ELECT REM PT RETURN 9FT ADLT (ELECTROSURGICAL) ×3
ELECTRODE REM PT RTRN 9FT ADLT (ELECTROSURGICAL) ×1 IMPLANT
EXTRACTOR VACUUM M CUP 4 TUBE (SUCTIONS) IMPLANT
EXTRACTOR VACUUM M CUP 4' TUBE (SUCTIONS)
GLOVE BIOGEL PI IND STRL 7.0 (GLOVE) ×2 IMPLANT
GLOVE BIOGEL PI INDICATOR 7.0 (GLOVE) ×4
GLOVE ECLIPSE 6.5 STRL STRAW (GLOVE) ×6 IMPLANT
GOWN STRL REUS W/TWL LRG LVL3 (GOWN DISPOSABLE) ×6 IMPLANT
KIT ABG SYR 3ML LUER SLIP (SYRINGE) IMPLANT
NEEDLE HYPO 22GX1.5 SAFETY (NEEDLE) ×3 IMPLANT
NEEDLE HYPO 25X5/8 SAFETYGLIDE (NEEDLE) IMPLANT
NS IRRIG 1000ML POUR BTL (IV SOLUTION) ×3 IMPLANT
PACK C SECTION WH (CUSTOM PROCEDURE TRAY) ×3 IMPLANT
PAD OB MATERNITY 4.3X12.25 (PERSONAL CARE ITEMS) ×3 IMPLANT
RTRCTR C-SECT PINK 25CM LRG (MISCELLANEOUS) IMPLANT
STRIP CLOSURE SKIN 1/2X4 (GAUZE/BANDAGES/DRESSINGS) IMPLANT
SUT CHROMIC GUT AB #0 18 (SUTURE) IMPLANT
SUT MNCRL 0 VIOLET CTX 36 (SUTURE) ×3 IMPLANT
SUT MON AB 2-0 SH 27 (SUTURE)
SUT MON AB 2-0 SH27 (SUTURE) IMPLANT
SUT MON AB 3-0 SH 27 (SUTURE) ×2
SUT MON AB 3-0 SH27 (SUTURE) ×1 IMPLANT
SUT MON AB 4-0 PS1 27 (SUTURE) IMPLANT
SUT MONOCRYL 0 CTX 36 (SUTURE) ×6
SUT PLAIN 2 0 (SUTURE)
SUT PLAIN 2 0 XLH (SUTURE) ×3 IMPLANT
SUT PLAIN ABS 2-0 CT1 27XMFL (SUTURE) IMPLANT
SUT VIC AB 0 CT1 36 (SUTURE) ×6 IMPLANT
SUT VIC AB 2-0 CT1 27 (SUTURE) ×2
SUT VIC AB 2-0 CT1 TAPERPNT 27 (SUTURE) ×1 IMPLANT
SUT VIC AB 4-0 KS 27 (SUTURE) ×3 IMPLANT
SUT VIC AB 4-0 PS2 27 (SUTURE) IMPLANT
SYR CONTROL 10ML LL (SYRINGE) ×3 IMPLANT
TOWEL OR 17X24 6PK STRL BLUE (TOWEL DISPOSABLE) ×3 IMPLANT
TRAY FOLEY BAG SILVER LF 14FR (SET/KITS/TRAYS/PACK) IMPLANT

## 2017-01-26 NOTE — Anesthesia Preprocedure Evaluation (Addendum)
Anesthesia Evaluation  Patient identified by MRN, date of birth, ID band Patient awake    Reviewed: Allergy & Precautions, H&P , NPO status , Patient's Chart, lab work & pertinent test results  Airway Mallampati: III  TM Distance: >3 FB Neck ROM: full    Dental no notable dental hx. (+) Teeth Intact   Pulmonary former smoker,    Pulmonary exam normal breath sounds clear to auscultation       Cardiovascular negative cardio ROS Normal cardiovascular exam Rhythm:Regular Rate:Normal     Neuro/Psych negative neurological ROS  negative psych ROS   GI/Hepatic Neg liver ROS, GERD  ,  Endo/Other  Morbid obesityPCOS  Renal/GU negative Renal ROS  negative genitourinary   Musculoskeletal negative musculoskeletal ROS (+)   Abdominal (+) + obese,   Peds  Hematology  (+) anemia ,   Anesthesia Other Findings   Reproductive/Obstetrics (+) Pregnancy PPROM @33  weeks                            Anesthesia Physical Anesthesia Plan  ASA: III and emergent  Anesthesia Plan: Spinal   Post-op Pain Management:    Induction:   PONV Risk Score and Plan: 4 or greater and Ondansetron, Dexamethasone, Scopolamine patch - Pre-op, Metaclopromide and Treatment may vary due to age or medical condition  Airway Management Planned: Natural Airway  Additional Equipment:   Intra-op Plan:   Post-operative Plan:   Informed Consent: I have reviewed the patients History and Physical, chart, labs and discussed the procedure including the risks, benefits and alternatives for the proposed anesthesia with the patient or authorized representative who has indicated his/her understanding and acceptance.   Dental advisory given  Plan Discussed with: CRNA, Anesthesiologist and Surgeon  Anesthesia Plan Comments:        Anesthesia Quick Evaluation

## 2017-01-26 NOTE — Anesthesia Procedure Notes (Signed)
Spinal  Patient location during procedure: OR Start time: 01/26/2017 5:20 PM Staffing Anesthesiologist: Mal AmabileFOSTER, Micheal Murad Performed: anesthesiologist  Preanesthetic Checklist Completed: patient identified, site marked, surgical consent, pre-op evaluation, timeout performed, IV checked, risks and benefits discussed and monitors and equipment checked Spinal Block Patient position: sitting Prep: site prepped and draped and DuraPrep Patient monitoring: heart rate, cardiac monitor, continuous pulse ox and blood pressure Approach: midline Location: L3-4 Injection technique: single-shot Needle Needle type: Spinocan and Tuohy  Needle gauge: 24 G Needle length: 9 cm Needle insertion depth: 9 cm Assessment Sensory level: T4 Additional Notes Attempt x 2 with 24 Ga Pencan unsuccesful due to poor patient positioning and poor anatomical landmarks due to MO. Attempt x 1 with 17 Ga Touhy needle using LOR with air. No CSF, heme or paresthesias encountered.  24 Ga Spinocan needle through Touhy needle. CSF clear with free flow. LA + narcotics injected. Needles withdrawn and patient placed supine with LUD. Adequate sensory level.Patient tolerated procedure well.

## 2017-01-26 NOTE — Brief Op Note (Signed)
01/11/2017 - 01/26/2017  6:51 PM  PATIENT:  Nina Sanchez  36 y.o. female  PRE-OPERATIVE DIAGNOSIS:  PPROM, Non-reassuring fetal heart tracing, BPP 4/8, IUP @ 33 3/7 weeks  POST-OPERATIVE DIAGNOSIS:  PPROM, Non-reassuring fetal heart tracing, BPP 4/8, IUP @ 33 3/7 weeks  PROCEDURE:  Primary Classical cesarean section  SURGEON:  Surgeon(s) and Role:    * Maxie Betterousins, Daryn Hicks, MD - Primary  PHYSICIAN ASSISTANT:   ASSISTANTS: Carlean JewsMeredith Sigmon, CNM   ANESTHESIA:   spinal FINDINGS: floating vtx live female with cord next to chest and left foot. Ant placenta, polycystic ovaries, nl tubes EBL:  Total I/O In: 2300 [I.V.:2300] Out: 1300 [Urine:600; Blood:700]  BLOOD ADMINISTERED:none  DRAINS: none   LOCAL MEDICATIONS USED:  MARCAINE     SPECIMEN:  Source of Specimen:  placenta  DISPOSITION OF SPECIMEN:  PATHOLOGY  COUNTS:  YES  TOURNIQUET:  * No tourniquets in log *  DICTATION: .Other Dictation: Dictation Number [id number]  PLAN OF CARE: Admit to inpatient   PATIENT DISPOSITION:  PACU - hemodynamically stable.   Delay start of Pharmacological VTE agent (>24hrs) due to surgical blood loss or risk of bleeding: no

## 2017-01-26 NOTE — Anesthesia Postprocedure Evaluation (Signed)
Anesthesia Post Note  Patient: Nina Sanchez  Procedure(s) Performed: Procedure(s) (LRB): CESAREAN SECTION (N/A)     Patient location during evaluation: PACU Anesthesia Type: Spinal Level of consciousness: oriented and awake and alert Pain management: pain level controlled Vital Signs Assessment: post-procedure vital signs reviewed and stable Respiratory status: spontaneous breathing, respiratory function stable and nonlabored ventilation Cardiovascular status: blood pressure returned to baseline and stable Postop Assessment: no headache, no backache, spinal receding, patient able to bend at knees and no signs of nausea or vomiting Anesthetic complications: no    Last Vitals:  Vitals:   01/26/17 2007 01/26/17 2008  BP:    Pulse: 95 92  Resp: (!) 27 (!) 24  Temp:      Last Pain:  Vitals:   01/26/17 1945  TempSrc:   PainSc: 0-No pain   Pain Goal: Patients Stated Pain Goal: 5 (01/26/17 1915)               Khalise Billard A.

## 2017-01-26 NOTE — Progress Notes (Signed)
Called by RN regarding BPP 4/8.  Given indication : spontaneous decelerations in a setting of PPROM with Unfavorable cervix( long /closed/OOP), will proceed with Primary C/S. Pt agrees with plan.  Risk of C/S reviewed including infection, bleeding, injury to bowels, bladder, ureter, internal Scar tissue, poss need for C/S in the future. Consent signed . OR notified

## 2017-01-26 NOTE — Transfer of Care (Signed)
Immediate Anesthesia Transfer of Care Note  Patient: Nina Sanchez  Procedure(s) Performed: Procedure(s): CESAREAN SECTION (N/A)  Patient Location: PACU  Anesthesia Type:Spinal  Level of Consciousness: awake, alert  and oriented  Airway & Oxygen Therapy: Patient Spontanous Breathing  Post-op Assessment: Report given to RN and Post -op Vital signs reviewed and stable  Post vital signs: Reviewed and stable BP 138/66, HR 84, RR 20, SaO2 100%  Last Vitals:  Vitals:   01/26/17 0800 01/26/17 1200  BP: 130/72 (!) 115/53  Pulse: (!) 101 (!) 103  Resp: 18 18  Temp: 36.8 C 36.8 C    Last Pain:  Vitals:   01/26/17 1200  TempSrc: Oral  PainSc:       Patients Stated Pain Goal: 0 (01/24/17 1945)  Complications: No apparent anesthesia complications

## 2017-01-27 ENCOUNTER — Encounter (HOSPITAL_COMMUNITY): Payer: Self-pay | Admitting: Obstetrics and Gynecology

## 2017-01-27 LAB — CBC
HCT: 29.1 % — ABNORMAL LOW (ref 36.0–46.0)
HEMOGLOBIN: 9.4 g/dL — AB (ref 12.0–15.0)
MCH: 22.4 pg — AB (ref 26.0–34.0)
MCHC: 32.3 g/dL (ref 30.0–36.0)
MCV: 69.5 fL — AB (ref 78.0–100.0)
PLATELETS: 278 10*3/uL (ref 150–400)
RBC: 4.19 MIL/uL (ref 3.87–5.11)
RDW: 15.8 % — ABNORMAL HIGH (ref 11.5–15.5)
WBC: 14.6 10*3/uL — ABNORMAL HIGH (ref 4.0–10.5)

## 2017-01-27 MED ORDER — FERROUS SULFATE 325 (65 FE) MG PO TABS
325.0000 mg | ORAL_TABLET | Freq: Two times a day (BID) | ORAL | Status: DC
  Administered 2017-01-27 – 2017-01-29 (×5): 325 mg via ORAL
  Filled 2017-01-27 (×5): qty 1

## 2017-01-27 MED ORDER — PANTOPRAZOLE SODIUM 40 MG PO TBEC
40.0000 mg | DELAYED_RELEASE_TABLET | Freq: Two times a day (BID) | ORAL | Status: DC
  Administered 2017-01-27 – 2017-01-29 (×4): 40 mg via ORAL
  Filled 2017-01-27 (×4): qty 1

## 2017-01-27 MED ORDER — MAGNESIUM OXIDE 400 (241.3 MG) MG PO TABS
400.0000 mg | ORAL_TABLET | Freq: Every day | ORAL | Status: DC
  Administered 2017-01-27 – 2017-01-29 (×3): 400 mg via ORAL
  Filled 2017-01-27 (×4): qty 1

## 2017-01-27 NOTE — Lactation Note (Signed)
This note was copied from a baby's chart. Lactation Consultation Note  Patient Name: Boy Bolivar Hawntwonette Calzada ZOXWR'UToday's Date: 01/27/2017 Reason for consult: Initial assessment;NICU baby Breastfeeding consultation services and support information given to patient.  Providing Breastmilk For Your Baby in NICU handout also given.  This is mom's first baby and newborn is 5918 hours old.  Mom has symphony pump set up and has pumped 3 times.  No milk obtained yet.  Mom has very large breasts with some areolar edema.  Recommended she wear her bra.  Mom shown how to hand express and one drop obtained.  Reviewed pump operation and assisted with pumping.  Instructed to pump 8-12 times in 24 hours followed by hand expression.  Mom plans on calling insurance company about a pump after discharge.  Discussed rental pumps.  Encouraged to call out for assist/concerns prn.  Maternal Data Has patient been taught Hand Expression?: Yes Does the patient have breastfeeding experience prior to this delivery?: No  Feeding    LATCH Score/Interventions                      Lactation Tools Discussed/Used WIC Program: No Pump Review: Setup, frequency, and cleaning;Milk Storage Initiated by:: RN Date initiated:: 01/27/17   Consult Status Consult Status: Follow-up Date: 01/28/17 Follow-up type: In-patient    Huston FoleyMOULDEN, Celestine Prim S 01/27/2017, 1:22 PM

## 2017-01-27 NOTE — Progress Notes (Addendum)
POSTOPERATIVE DAY # 1 S/P Primary Low Vertical CS for NRFHR, BPP 4/8, prolonged premature rupture of membranes   S:         Reports feeling okay, very tired, having some burning pain around incision and with movement  Reports she did not sleep at all last night due to the pain and itching, and has been active today going to and from NICU   Took a shower earlier today   Reports severe itching - has tried Benadryl and Nubain with minimal relief              Tolerating po intake / no nausea / no vomiting / no flatus / no BM  Denies dizziness, SOB, or CP             Bleeding is light             Pain somewhat controlled with Oxycodone, Motrin, and Tylenol             Up ad lib / ambulatory/ voiding QS  Newborn in NICU - mom is pumping and has worked with lactation to hand express, but states she has only gotten 1 drop of colostrum. She reports baby is doing well in NICU and not requiring oxygen  / Circumcision - planning prior to d/c from NICU   O:  VS: BP 129/63 (BP Location: Left Arm)   Pulse 71   Temp 98.6 F (37 C) (Oral)   Resp 18   Ht 5\' 6"  (1.676 m)   Wt 117.9 kg (260 lb)   SpO2 100%   Breastfeeding? Unknown   BMI 41.97 kg/m    LABS:               Recent Labs  01/25/17 0838 01/27/17 0537  WBC 12.1* 14.6*  HGB 10.8* 9.4*  PLT 284 278               Bloodtype: --/--/O POS (07/23 1429)  Rubella: Immune (02/08 0000)                                             I&O: Intake/Output      07/23 0701 - 07/24 0700 07/24 0701 - 07/25 0700   P.O. 360    I.V. (mL/kg) 2300 (19.5)    Total Intake(mL/kg) 2660 (22.6)    Urine (mL/kg/hr) 1525 (0.5)    Blood 700    Total Output 2225     Net +435          Urine Occurrence 1 x                 Physical Exam:             Alert and Oriented X3  Lungs: Clear and unlabored  Heart: regular rate and rhythm / no mumurs  Abdomen: soft, non-tender, non-distended, hypoactive bowel sounds in all quadrants              Fundus: firm,  non-tender, U-1             Dressing: honeycomb dsg with steri-strips - some moisture noted under pannus - minimal shadowing noted on dsg             Incision:  approximated with sutures / no erythema / no ecchymosis / no drainage  Perineum: intact  Lochia: appropriate, no clots  Extremities: RLE +  2 pitting edema, LLE +1 edema, no calf pain or tenderness  A:        POD # 1 S/P Primary Low Vertical CS for NRFHR, BPP 4/8, prolonged premature rupture of membranes            S/p preterm delivery  ABL Anemia compounding IDA  Cutaneous yeast - s/p Diflucan 150mg  x 1  P:        Routine postoperative care              Advised to keep pannus as dry as possible - may need frequent dsg changes to keep incision dry   Encouraged rest today  Encouraged to pump every 2-3 hours   Continue current care  Carlean JewsMeredith Aiana Nordquist, MSN, CNM [facility]

## 2017-01-27 NOTE — Op Note (Signed)
NAME:  Nina Sanchez, Nina Sanchez         ACCOUNT NO.:  [id number]  MEDICAL RECORD NO.:  [id number]  LOCATION:  9309                          FACILITY:  [facility]  PHYSICIAN:  Maxie BetterSheronette Dontay Harm, M.D.DATE OF BIRTH:  [birth year]  DATE OF PROCEDURE:  01/26/2017 DATE OF DISCHARGE:                              OPERATIVE REPORT   PREOPERATIVE DIAGNOSES: 1. Prolonged premature rupture of membranes. 2. Nonreassuring fetal testing. 3. Biophysical profile 4/8. 4. Intrauterine gestation at 7933 and 3/[redacted] weeks gestation.  PROCEDURE:  Primary classical cesarean section.  POSTOPERATIVE DIAGNOSES: 1. Prolonged premature rupture of membranes. 2. Nonreassuring fetal testing. 3. Biophysical profile 4/8. 4. Intrauterine gestation at 833 and 3/[redacted] weeks gestation.  ANESTHESIA:  Spinal.  SURGEON:  Maxie BetterSheronette Anela Bensman, M.D.  ASSISTANT:  Carlean JewsMeredith Sigmon, CNM.  DESCRIPTION OF PROCEDURE:  Under adequate spinal anesthesia, the patient was placed in the supine position with a left lateral tilt.  She was sterilely prepped and draped in the usual sterile fashion.  An indwelling Foley catheter was sterilely placed.  Marcaine 0.25% was then injected along the planned Pfannenstiel skin incision site. Pfannenstiel skin incision was then made, carried down to the rectus fascia.  Rectus fascia was opened transversely.  The rectus fascia was bluntly and sharply dissected off the rectus muscle in superior and inferior fashion.  The rectus muscle was split in the midline.  The parietal peritoneum was entered bluntly and extended.  An Alexis self- retaining retractor was then placed and inspection reveals an undeveloped lower uterine segment.  Therefore, decision was made to perform a vertical incision which was then carried down until the amniotic sac was noted.  Amniotic fluid rupture was done.  Clear fluid. Initially attempted delivery of high floating vertex, was unsuccessful. Incision was therefore extended superiorly.   Again, the delivery was still unsuccessful.  Therefore, a Kiwi vacuum was used to deliver the head with a loop of cord noted next to the chest.  The baby was bulb suctioned.  Subsequently delivered, noted to have additional cord around the left foot which was reduced.  Cord was clamped, cut.  The baby was transferred to the awaiting pediatricians.  Apgars were 5 and 8 at one and five minutes respectively. Placenta was manually removed.  Uterus was exteriorized.  Uterine cavity was cleaned of debris.  The uterine incision was then closed in 3 layers of 0 Monocryl running stitches and interrupted figure-of-eight sutures until the serosal surface was reached, at which time, 3-0 Monocryl baseball stitch was used to approximate the serosal surface.  Normal tubes were noted bilaterally. Large polycystic ovaries were noted.  The abdomen was irrigated and suctioned of debris.  Interceed was placed over the overlying incision. The Alexis retractor was then removed.  The parietal peritoneum was closed with 2-0 Vicryl.  The rectus fascia was closed with 0 Vicryl x2. The subcutaneous area was irrigated, small bleeders cauterized. Interrupted 2-0 plain sutures placed and the skin approximated with 4-0 Vicryl subcuticular closure.  Benzoin and Steri-Strips were placed.  SPECIMEN:  Placenta sent to Pathology.  ESTIMATED BLOOD LOSS:  700 mL.  INTRAOPERATIVE FLUID:  2300 mL.  URINE OUTPUT:  100 mL.  SPONGE AND INSTRUMENT COUNT:  Counts x2 was correct.  COMPLICATION:  None.  DISPOSITION:  The patient tolerated the procedure well, was transferred to recovery room in stable condition.  The baby was transferred in the NICU due to prematurity.     Maxie Better, M.D.     [place]/MEDQ  D:  01/26/2017  T:  01/27/2017  Job:  [id number]

## 2017-01-27 NOTE — Progress Notes (Signed)
S; called by RN regarding variable decelerations Pt denies ctx  Notes leaking fluid (+) FM  O:  Tracing reviewed: baseline 145- 150 (+) accels (+) variable decels Down to 60's x 40 sec, 2 mins and a prolonged one that was audible earlier Current tracing shows return to baseline with good variability with a ccels No ctx  Noted WDWN BF in NAD VS BP 115/53  T98.2 Abd gravid soft non tender VE: long/closed/out of pelvis Pad  (-) blood extr (+) edema  IMp: PPROM @ 33 3/7 wk with evidence of cord compression IUP @ 33 3/7 weeks BMZ complete, s/p magnesium P) stat BPP. Pt advised of probable need for delivery ( mode depending on BPP). Although informed most likely C/s given unfavorable cervix

## 2017-01-27 NOTE — Progress Notes (Signed)
CSW acknowledges NICU admission.    Patient screened out for psychosocial assessment since none of the following apply:  Psychosocial stressors documented in mother or baby's chart  Gestation less than 32 weeks  Code at delivery   Infant with anomalies  Please contact the Clinical Social Worker if specific needs arise, or by MOB's request.       

## 2017-01-28 NOTE — Progress Notes (Signed)
No c/o; pain controlled; tol po; voiding w/o difficulty; not much flatus Ambulating; normal lochia  Temp:  [97.9 F (36.6 C)-98.3 F (36.8 C)] 98.3 F (36.8 C) (07/25 0954) Pulse Rate:  [66-96] 96 (07/25 0954) Resp:  [18-20] 20 (07/25 0954) BP: (113-135)/(53-75) 113/53 (07/25 0954) SpO2:  [100 %] 100 % (07/25 0954)  A&ox3 rrr ctab Abd: +bs, soft, nt, nd; fundus firm and below umb  CBC    Component Value Date/Time   WBC 14.6 (H) 01/27/2017 0537   RBC 4.19 01/27/2017 0537   HGB 9.4 (L) 01/27/2017 0537   HCT 29.1 (L) 01/27/2017 0537   PLT 278 01/27/2017 0537   MCV 69.5 (L) 01/27/2017 0537   MCH 22.4 (L) 01/27/2017 0537   MCHC 32.3 01/27/2017 0537   RDW 15.8 (H) 01/27/2017 0537   LYMPHSABS 2.8 01/25/2017 0838   MONOABS 0.3 01/25/2017 0838   EOSABS 0.3 01/25/2017 0838   BASOSABS 0.0 01/25/2017 0838   A/P: pod 2 s/p classical c/s 1. Doing well, contin care; likely d/c home tomorrow 2. Mild acute anemia - iron q day pp 3. Infant in nicu - fed from bottle this am 4. Rh pos 5. RI

## 2017-01-29 MED ORDER — IBUPROFEN 600 MG PO TABS: 600.0000 mg | ORAL_TABLET | Freq: Four times a day (QID) | ORAL | 0 refills | Status: DC

## 2017-01-29 MED ORDER — OXYCODONE HCL 5 MG PO TABS: 5.0000 mg | ORAL_TABLET | ORAL | 0 refills | Status: DC | PRN

## 2017-01-29 MED ORDER — MAGNESIUM OXIDE 400 (241.3 MG) MG PO TABS: 400.0000 mg | ORAL_TABLET | Freq: Every day | ORAL | 2 refills | Status: DC

## 2017-01-29 MED ORDER — FERROUS SULFATE 325 (65 FE) MG PO TABS: 325.0000 mg | ORAL_TABLET | Freq: Every day | ORAL | 0 refills | Status: DC

## 2017-01-29 NOTE — Progress Notes (Signed)
POSTOPERATIVE DAY # 3 S/P Primary Low Vertical CS for NRFHR, BPP 4/8, prolonged premature rupture of membranes   S:         Reports feeling okay, very tired, having some burning pain around incision and with movement  Has been active today going to and from NICU   Took a shower.  Reports severe itching - has tried Benadryl and Nubain with minimal relief              Tolerating po intake / no nausea / no vomiting / no flatus / no BM  Denies dizziness, SOB, or CP             Bleeding is light             Pain somewhat controlled with Oxycodone, Motrin, and Tylenol             Up ad lib / ambulatory/ voiding QS  Newborn in NICU - mom is pumping and has worked with lactation to hand express, but states she has only gotten 1 drop of colostrum. She reports baby is doing well in NICU and not requiring oxygen  / Circumcision - planning prior to d/c from NICU   O:  VS: BP (!) 128/57 (BP Location: Left Arm)   Pulse 94   Temp 98.4 F (36.9 C) (Oral)   Resp 20   Ht 5\' 6"  (1.676 m)   Wt 117.9 kg (260 lb)   SpO2 94%   Breastfeeding? Unknown   BMI 41.97 kg/m    LABS:                Recent Labs  01/27/17 0537  WBC 14.6*  HGB 9.4*  PLT 278               Bloodtype: --/--/O POS (07/23 1429)  Rubella: Immune (02/08 0000)                                             I&O: Intake/Output    None                Physical Exam:             Alert and Oriented X3  Lungs: Clear and unlabored  Heart: regular rate and rhythm / no mumurs  Abdomen: soft, non-tender, non-distended, hypoactive bowel sounds in all quadrants              Fundus: firm, non-tender, U-1             Dressing: honeycomb dsg with steri-strips - some moisture noted under pannus - minimal shadowing noted on dsg             Incision:  approximated with sutures / no erythema / no ecchymosis / no drainage  Perineum: intact  Lochia: appropriate, no clots  Extremities: RLE +2 pitting edema, LLE +1 edema, no calf pain or  tenderness  A:        POD # 3 S/P Primary Low Vertical CS for NRFHR, BPP 4/8, prolonged premature rupture of membranes            S/p preterm delivery  ABL Anemia compounding IDA  Cutaneous yeast - s/p Diflucan 150mg  x 1  P:        Routine postoperative care  Advised to keep pannus as dry as possible - may need frequent dsg changes to keep incision dry   Encouraged to pump every 2-3 hours   Continue current care

## 2017-01-29 NOTE — Discharge Summary (Signed)
OB Discharge Summary  Patient Name: Nina Sanchez DOB: 09-Aug-1980 MRN: [id number]  Date of admission: 01/11/2017  Admitting diagnosis: PPROM Intrauterine pregnancy: 31 2/7 weeks    Secondary diagnosis: IVF pregnancy   Date of discharge: 01/29/2017    Discharge diagnosis: PPROM, IUP @ 33 3/7 weeks, NRFHT, Classical Cesarean section, GBS culture positive      Prenatal history: G1P0101   EDC : 03/13/2017, by Other Basis  [facility]  Primary provider : Baylen Dea [facility]  Prenatal Labs: ABO, Rh: --/--/O POS (07/23 1429)  Antibody: NEG (07/23 1429) Rubella: Immune (02/08 0000)   RPR: Nonreactive (02/08 0000)  HBsAg: Negative (02/08 0000)  HIV: Non-reactive (02/08 0000)  GBS: Positive (07/09 1230)                                    Hospital course:  Sceduled C/S   36 y.o. yo G1P0101 at 29107w3d was admitted to the hospital 01/11/2017 for PPROM at 31 1/7 weeks on 01/11/2017. She completed BMZ course and IV antibiotic course for PPROM. GBS cx positive  Planned scheduled cesarean section with the following indication:Non-Reassuring FHR  Membrane Rupture Time/Date: 7:30 AM ,01/11/2017    Patient delivered a Viable infant.01/26/2017 via primary ces[facility] section [facility] with BPP 4/8 at 33 3/7 weeks. Details of operation can be found in separate operative note.  Pateint had an uncomplicated postpartum course.  She is ambulating, tolerating a regular diet, passing flatus, and urinating well. Patient is discharged home in stable condition on  02/09/17         Delivering PROVIDER: Aariona Momon                                                            Complications: none  Newborn Data: Live born female  Birth Weight: 4 lb 15.7 oz (2260 g) APGAR: 5, 8  Baby Feeding: Bottle and Breast Disposition:NICU  Post partum procedures:none  Labs: Lab Results  Component Value Date   WBC 14.6 (H)  01/27/2017   HGB 9.4 (L) 01/27/2017   HCT 29.1 (L) 01/27/2017   MCV 69.5 (L) 01/27/2017   PLT 278 01/27/2017   CMP Latest Ref Rng & Units 01/25/2017  Glucose 65 - 99 mg/dL 89  BUN 6 - 20 mg/dL 7  Creatinine 4.090.44 - 8.111.00 mg/dL 9.140.53  Sodium 782135 - 956145 mmol/L 135  Potassium 3.5 - 5.1 mmol/L 3.4(L)  Chloride 101 - 111 mmol/L 104  CO2 22 - 32 mmol/L 23  Calcium 8.9 - 10.3 mg/dL 8.9  Total Protein 6.5 - 8.1 g/dL 7.1  Total Bilirubin 0.3 - 1.2 mg/dL 2.1(H0.1(L)  Alkaline Phos 38 - 126 U/L 75  AST 15 - 41 U/L 20  ALT 14 - 54 U/L 17      Physical Exam @ time of discharge:  Vitals:   01/28/17 1304 01/28/17 1621 01/28/17 2008 01/29/17 0800  BP: (!) 118/57 130/74 128/61 (!) 128/57  Pulse: 90 (!) 103 92 94  Resp: 20 20 (!) 81 20  Temp: 98 F (36.7 C) 98.9 F (37.2 C) 98.3 F (36.8 C) 98.4 F (36.9 C)  TempSrc: Oral Oral Oral Oral  SpO2: 100% 97% 98% 94%  Weight:      Height:        General: alert, cooperative and no distress Lochia: appropriate Uterine Fundus: firm Perineum: intact Incision: Healing well with no significant drainage Extremities: DVT Evaluation: No evidence of DVT seen on physical exam.   Discharge instructions:  "Baby and Me Booklet" and [facility] Booklet  Discharge Medications:  Allergies as of 01/29/2017      Reactions   Hydrocodone Hives   Prednisone Nausea And Vomiting   Rocephin [ceftriaxone] Hives      Medication List    TAKE these medications   ferrous sulfate 325 (65 FE) MG tablet Take 1 tablet (325 mg total) by mouth daily with breakfast.   ibuprofen 600 MG tablet Commonly known as:  ADVIL,MOTRIN Take 1 tablet (600 mg total) by mouth every 6 (six) hours.   magnesium oxide 400 (241.3 Mg) MG tablet Commonly known as:  MAG-OX Take 1 tablet (400 mg total) by mouth daily.   oxyCODONE 5 MG immediate release tablet Commonly known as:  Oxy IR/ROXICODONE Take 1 tablet (5 mg total) by mouth every 4 (four) hours as needed (pain scale 4-7).    prenatal multivitamin Tabs tablet Take 1 tablet by mouth daily at 12 noon.       Diet: routine diet  Activity: Advance as tolerated. Pelvic rest x 6 weeks.   Follow up:6 weeks    Signed: Marlinda MikeBAILEY, TANYA CNM, MSN, [facility] AssociationFACNM 01/29/2017, 3:48 PM

## 2017-01-30 ENCOUNTER — Inpatient Hospital Stay (HOSPITAL_COMMUNITY): Payer: BLUE CROSS/BLUE SHIELD

## 2017-01-30 ENCOUNTER — Ambulatory Visit: Payer: Self-pay

## 2017-01-30 ENCOUNTER — Other Ambulatory Visit: Payer: Self-pay | Admitting: Obstetrics and Gynecology

## 2017-01-30 ENCOUNTER — Encounter (HOSPITAL_COMMUNITY): Payer: Self-pay | Admitting: Obstetrics and Gynecology

## 2017-01-30 NOTE — Lactation Note (Signed)
This note was copied from a baby's chart. Lactation Consultation Note  Patient Name: Nina Sanchez UEAVW'UToday's Date: 01/30/2017 Reason for consult: Follow-up assessment    With this mom of a NICU baby, now 6393 hours old. Mom is without a DEP at this time, reports she does have WIC, so I loaned her a DEP for $30.00, which she will get back when [facility] days. Mom was shown how to use the pump, and denied any further questions at this  time.    Maternal Data    Feeding    LATCH Score/Interventions                      Lactation Tools Discussed/Used WIC Program: Yes (mom states she does have WIC, laoned a DEP )   Consult Status Consult Status: PRN Follow-up type: In-patient (NICU)    [name], [name] 01/30/2017, 3:17 PM

## 2017-02-04 ENCOUNTER — Ambulatory Visit: Payer: Self-pay

## 2017-02-04 NOTE — Lactation Note (Signed)
This note was copied from a baby's chart. Lactation Consultation Note  Patient Name: Nina Sanchez MVHQI'OToday's Date: 02/04/2017 Reason for consult: Follow-up assessment Baby at 9 days of life. Mom requested lactation to help latch baby. Upon arrival mom stated she did not want to latch baby today she would do it tomorrow. RN suggested that mom let lactation help. Mom agreed to latch baby. Mom has large breast with flat nipples. She was holding baby in football position. Lactation tried to move baby from under the breast and mom stated that is the position baby needs to be in to feed. Applied #20 NS because baby was licking at the nipple. After 1 minute mom stated, "he is not into this right now". Encouraged mom to keep trying and she stated she was "done for now". Offered to help mom place baby sts and she stated she would do that tomorrow. Tried to educate mom but she kept saying, "I already know that" or "I am aware". Offered to have lactation come to see mom tomorrow and she stated, "I call yall when I am on the way and you can meet me here so I don't have to wait so long". Tried to explain that lactation might not be able to come as soon as mom calls but we really do want to help her bf her baby.   Maternal Data    Feeding Feeding Type: Breast Fed Length of feed: 0 min  LATCH Score Latch: Too sleepy or reluctant, no latch achieved, no sucking elicited.  Audible Swallowing: None  Type of Nipple: Flat  Comfort (Breast/Nipple): Soft / non-tender  Hold (Positioning): Full assist, staff holds infant at breast  LATCH Score: 3  Interventions    Lactation Tools Discussed/Used Tools: Nipple Shields Nipple shield size: 20   Consult Status Consult Status: PRN    Nina Sanchez 02/04/2017, 5:18 PM

## 2017-02-09 ENCOUNTER — Ambulatory Visit: Payer: Self-pay

## 2017-02-09 NOTE — Lactation Note (Addendum)
This note was copied from a baby's chart. Lactation Consultation Note  Patient Name: Nina Sanchez ZOXWR'UToday's Date: 02/09/2017 Reason for consult: Follow-up assessment   Baby 32 weeks old.  3549w3d < 6 lbs in NICU. Assisted w/ latching baby in cross cradle provided pillow under for support. Baby latched briefly but did not sustain latch. Applied #20NS and baby sustained latch for approx 13 min while being gavage fed. Encouraged mother to continue post pumping.  Hand express before, after and often. Mother is pumping approx 60 ml +. Mother states her nipples are cracked at base.  She is using #21 flanges. Suggest applying ebm and coconut oil to nipples for healing, adjust suction also. Provided mother w/ extra #20NS. Discussed how to wean baby off NS.   Maternal Data    Feeding Feeding Type: Breast Fed Length of feed: 13 min  LATCH Score Latch: Repeated attempts needed to sustain latch, nipple held in mouth throughout feeding, stimulation needed to elicit sucking reflex.  Audible Swallowing: A few with stimulation  Type of Nipple: Everted at rest and after stimulation  Comfort (Breast/Nipple): Soft / non-tender  Hold (Positioning): Assistance needed to correctly position infant at breast and maintain latch.  LATCH Score: 7  Interventions    Lactation Tools Discussed/Used Tools: Nipple Shields Nipple shield size: 20   Consult Status Consult Status: Follow-up Date: 02/10/17 Follow-up type: In-patient    Dahlia ByesBerkelhammer, [facility] 02/09/2017, 5:53 PM

## 2017-02-10 ENCOUNTER — Ambulatory Visit: Payer: Self-pay

## 2017-02-10 NOTE — Lactation Note (Signed)
This note was copied from a baby's chart. Lactation Consultation Note  Patient Name: Nina Sanchez ZOXWR'UToday's Date: 02/10/2017   NICU baby 482 weeks old. Mom requested LC assist with latching, but [facility]. Waited for several minutes, but mom's call continued, so this LC excused herself stating that she would return.  Maternal Data    Feeding Feeding Type: Breast Milk Length of feed: 60 min  LATCH Score                   Interventions    Lactation Tools Discussed/Used     Consult Status      Sherlyn HayJennifer D Laurna Shetley 02/10/2017, 11:47 AM

## 2017-02-10 NOTE — Lactation Note (Signed)
This note was copied from a baby's chart. Lactation Consultation Note  Patient Name: Boy Bolivar Hawntwonette Phillippi ONGEX'BToday's Date: 02/10/2017 Reason for consult: Follow-up assessment;NICU baby  NICU baby 712 weeks old. Mom reports that she does not feel like she had the baby in a good position for latching. Baby sleepy and has just been tube-fed, so demonstrated football position on left breast for mom to use at next attempt. Enc mom to use rolled cloth diaper to support her large, pendulous breast. Demonstrated to mom how to apply #20 NS as well. After applying NS and demonstrated positioning, mom's flowing and NS filled with breast milk. However, baby still not interested in attempted to latch. Mom reports that she is trying to pump every 3 hours, but is not always able to do so. Mom states that her 36-year-old boy makes it hard for her to pump as well. Discussed some strategies to use with 10436-year-old. Enc mom to call for assistance as needed.   Maternal Data    Feeding Feeding Type: Breast Milk Length of feed: 60 min  LATCH Score                   Interventions Interventions: Position options  Lactation Tools Discussed/Used     Consult Status Consult Status: PRN    Sherlyn HayJennifer D Nishita Isaacks 02/10/2017, 12:17 PM

## 2017-02-12 ENCOUNTER — Ambulatory Visit: Payer: Self-pay

## 2017-02-12 NOTE — Lactation Note (Signed)
This note was copied from a baby's chart. Lactation Consultation Note  Patient Name: Boy Bolivar Hawntwonette Lysne ZOXWR'UToday's Date: 02/12/2017 Reason for consult: Follow-up assessment;NICU baby  NICU baby 252 weeks old. Assisted mom with latching baby to right breast first in football position using #20 NS. Baby would latch, but would not suckle. Changed baby's diaper and refitted mom with #16 NS. Placed baby in cross-cradle position and baby latched to right breast and suckled with lips flanged and a few swallows noted. Mom's breasts are large, but nipple small. Assisted mom with using rolled blankets to support breast and baby at the breast. Mom's milk flows easily into NS and baby willing to continue suckling. Enc mom to use this position on both sides with #16 NS.   Maternal Data    Feeding Feeding Type: Breast Fed Nipple Type: Dr. Levert FeinsteinBrowns Ultra Preemie Length of feed: 35 min  LATCH Score Latch: Repeated attempts needed to sustain latch, nipple held in mouth throughout feeding, stimulation needed to elicit sucking reflex.  Audible Swallowing: A few with stimulation  Type of Nipple: Everted at rest and after stimulation  Comfort (Breast/Nipple): Soft / non-tender  Hold (Positioning): Assistance needed to correctly position infant at breast and maintain latch.  LATCH Score: 7  Interventions Interventions: Assisted with latch;Skin to skin;Hand express;Adjust position;Support pillows;Position options  Lactation Tools Discussed/Used Tools: Nipple Shields Nipple shield size: 16   Consult Status Consult Status: PRN    Sherlyn HayJennifer D Quill Grinder 02/12/2017, 11:36 AM

## 2017-12-17 IMAGING — US US MFM FETAL BPP W/O NON-STRESS
1 series · 15 of 28 positions shown · non-contrast
Comparison: none

[Series 1: us mfm fetal bpp w/o non-stress · 40 acquisitions, 15 frames shown]
[im 1/40]
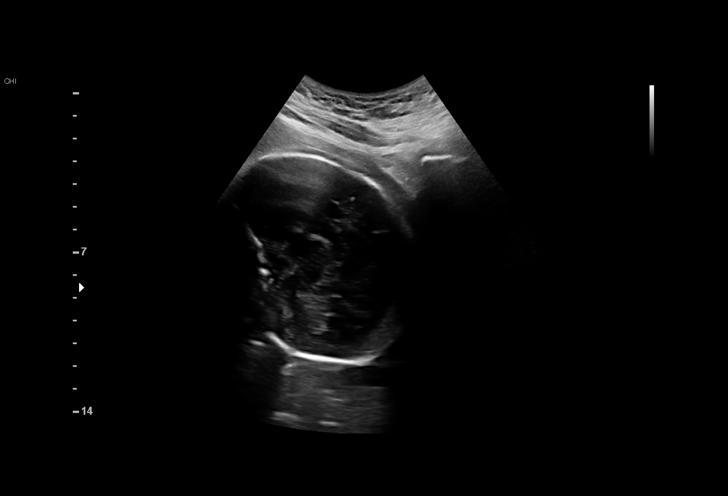
[im 3/40]
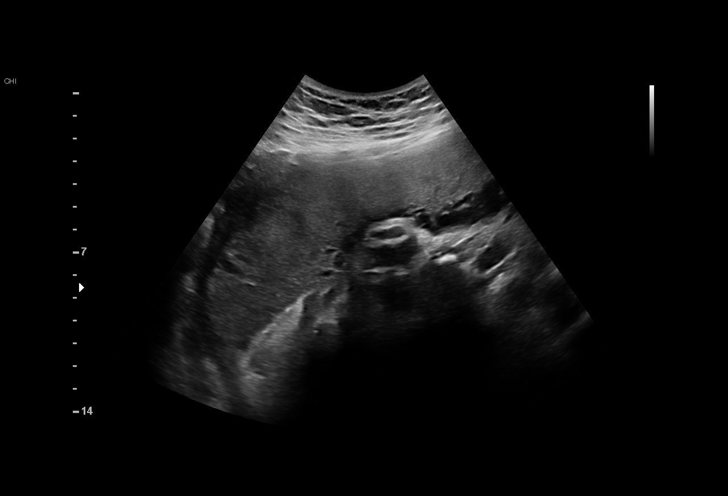
[im 6/40]
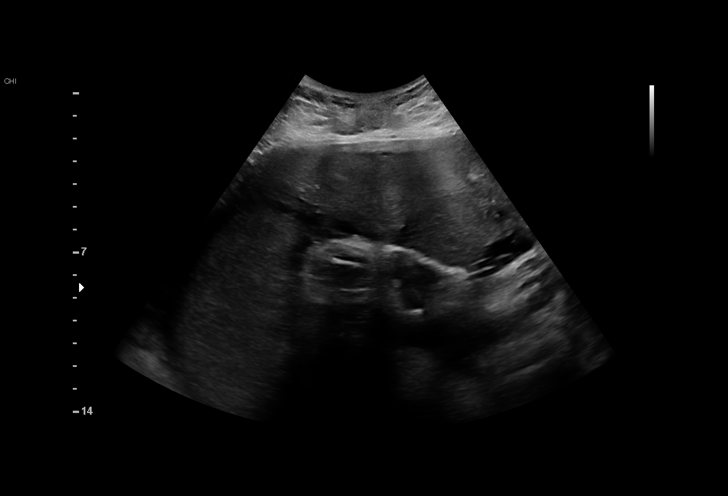
[im 9/40]
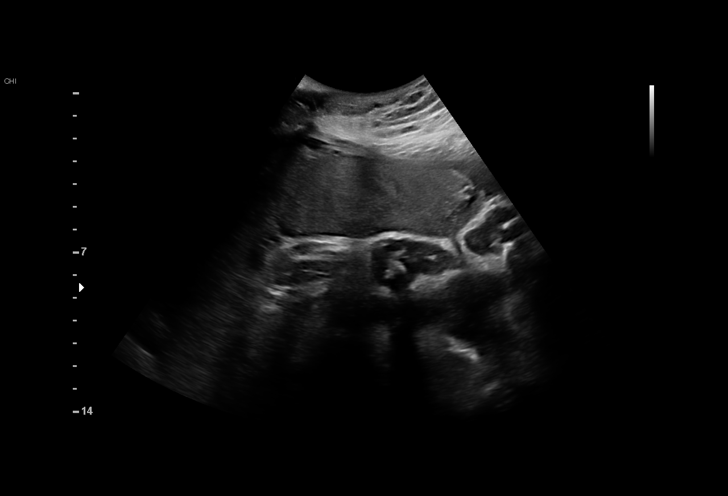
[im 12/40]
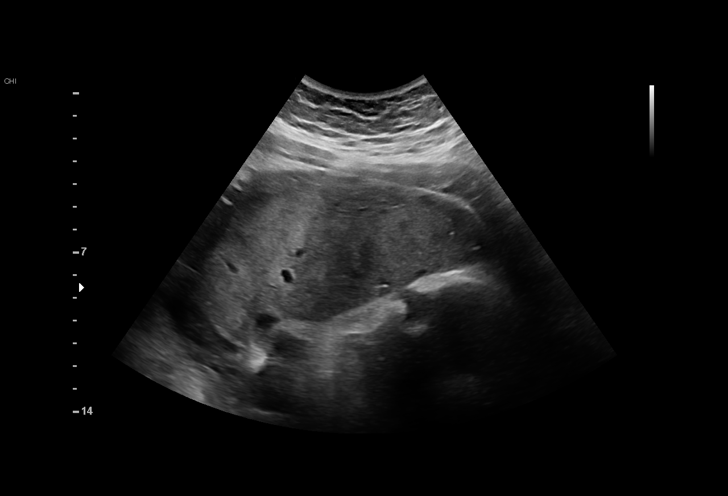
[im 15/40]
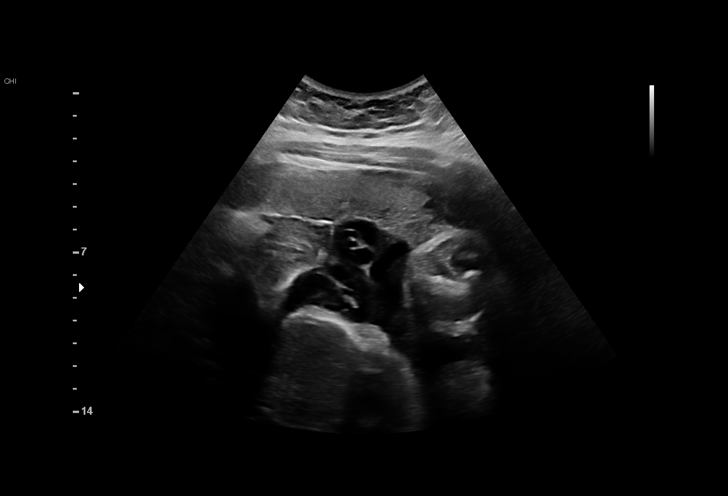
[im 18/40]
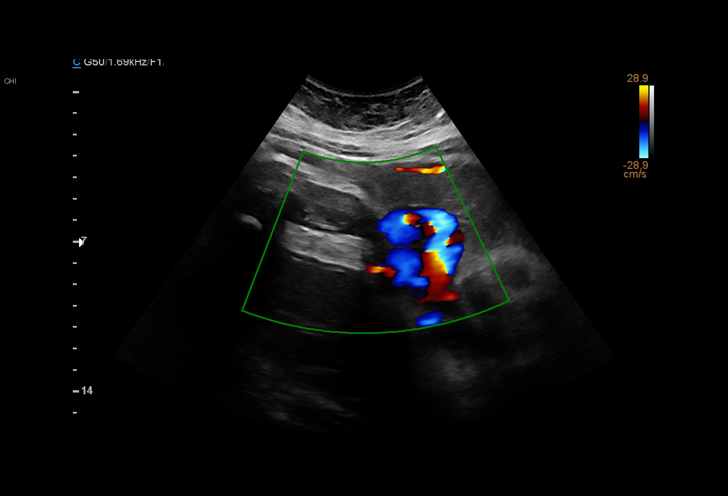
[im 21/40]
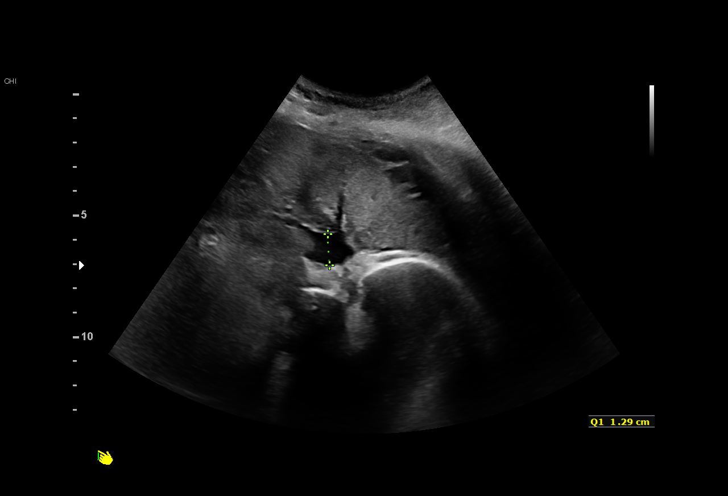
[im 22/40]
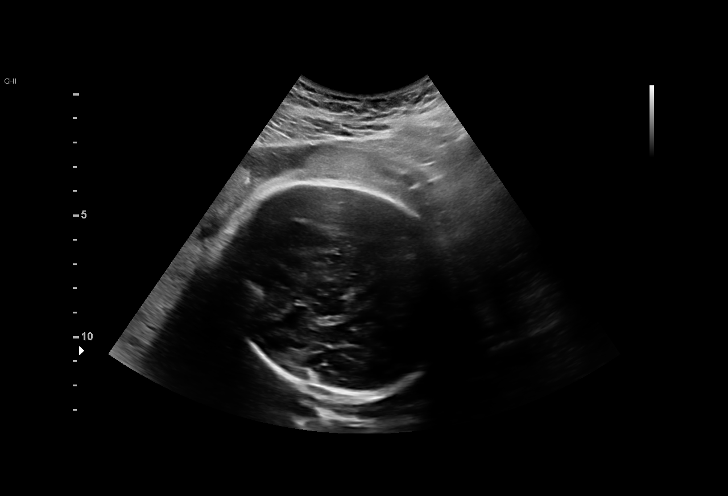
[im 25/40]
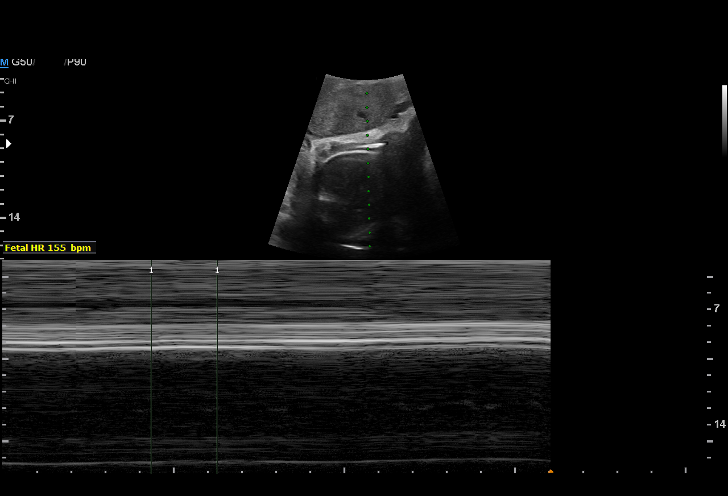
[im 28/40]
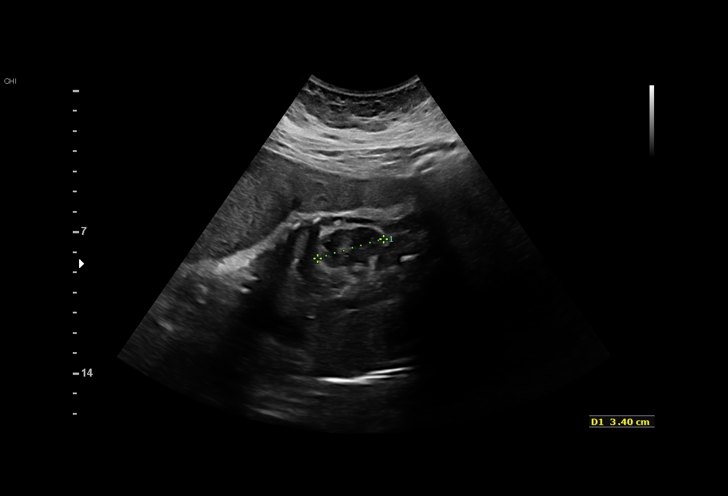
[im 31/40]
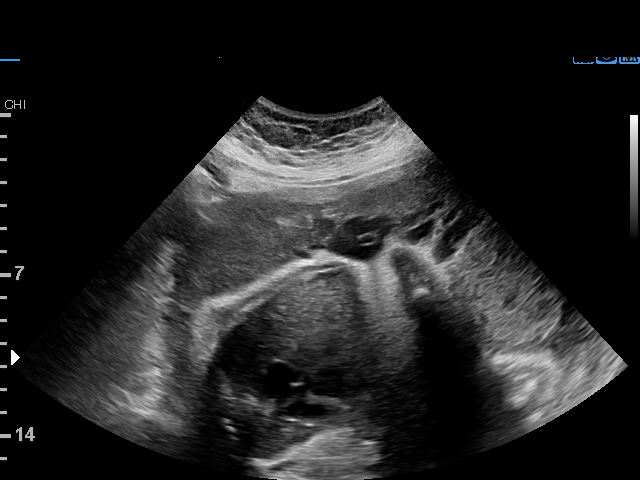
[im 34/40]
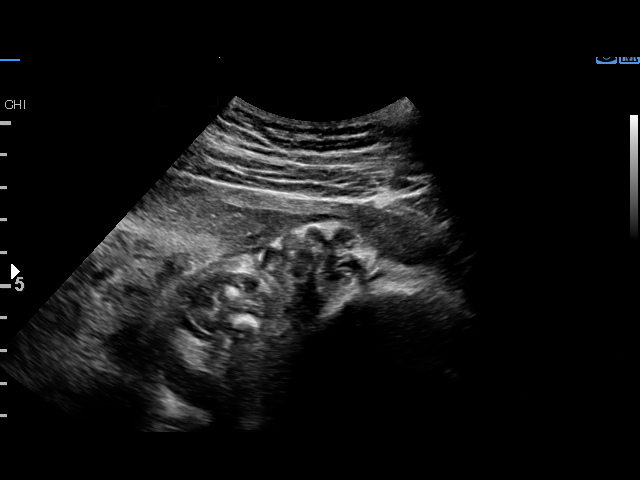
[im 37/40]
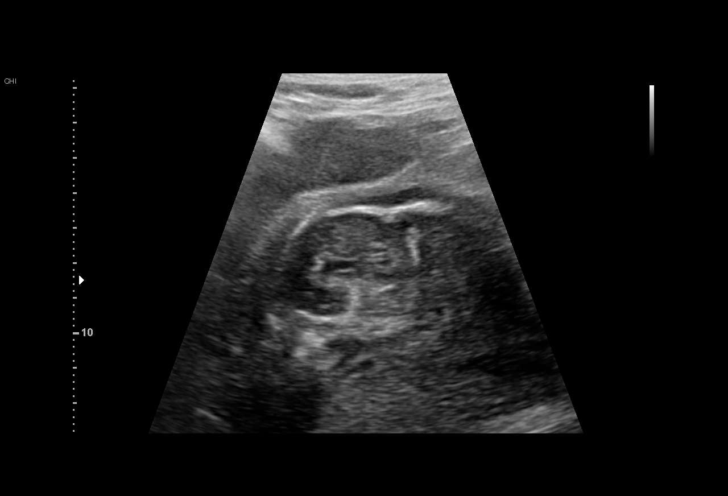
[im 40/40]
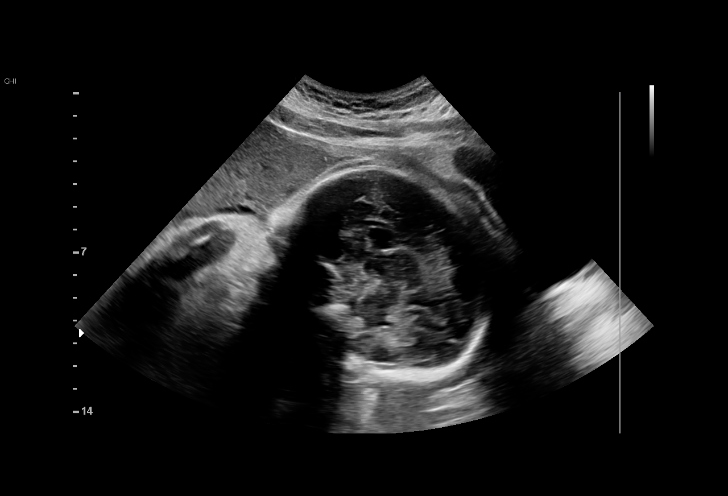

[15 of 28 positions shown; findings below may reference images not displayed]

[facility]
Attending:        Deep Jyoti Gojiya       Secondary Phy.:   3rd Nursing- HR
OB

1  ZUBERU               [id number]      [id number]     [id number]
ABHISHEK
Indications

33 weeks gestation of pregnancy
Non-reactive NST
Premature rupture of membranes - leaking
fluid
Advanced maternal age primigravida 35+,
third trimester
OB History

Gravidity:    1         Term:   0        Prem:   0        SAB:   0
TOP:          0       Ectopic:  0        Living: 0
Fetal Evaluation

Num Of Fetuses:     1
Fetal Heart         155
Rate(bpm):
Cardiac Activity:   Observed
Presentation:       Cephalic
Placenta:           Anterior, above cervical os

Amniotic Fluid
AFI FV:      Oligohydramnios

AFI Sum(cm)     %Tile       Largest Pocket(cm)
1.29            < 3
RUQ(cm)
1.29
Biophysical Evaluation

Amniotic F.V:   Pocket < 2 cm two          F. Tone:        Observed
planes
F. Movement:    Observed                   Score:          [DATE]
F. Breathing:   Not Observed
Gestational Age

LMP:           33w 3d       Date:   06/06/16                 EDD:   03/13/17
Best:          33w 3d    Det. By:   LMP  (06/06/16)          EDD:   03/13/17
Anatomy

Stomach:               Appears normal, left   Kidneys:                Appear normal
sided
Cervix Uterus Adnexa

Cervix
Not visualized (advanced GA >66wks)
Impression

SIUP at 33+3 weeks
Cephalic presentation
Oligohydramnios
BPP [DATE] (-2 for oligo and BM)
Recommendations

Correlate with clinical scenario

## 2022-07-17 ENCOUNTER — Encounter: Payer: Self-pay | Admitting: Internal Medicine

## 2022-08-08 ENCOUNTER — Encounter: Payer: Self-pay | Admitting: Gastroenterology

## 2022-08-10 NOTE — Progress Notes (Unsigned)
GI Office Note    Referring Provider: Manon Hilding, MD Primary Care Physician:  Manon Hilding, MD  Primary Gastroenterologist:  Chief Complaint   No chief complaint on file.    History of Present Illness   Nina Sanchez is a 42 y.o. female presenting today for further evaluation of rectal bleeding.         Medications   Current Outpatient Medications  Medication Sig Dispense Refill   ferrous sulfate 325 (65 FE) MG tablet Take 1 tablet (325 mg total) by mouth daily with breakfast. 45 tablet 0   ibuprofen (ADVIL,MOTRIN) 600 MG tablet Take 1 tablet (600 mg total) by mouth every 6 (six) hours. 30 tablet 0   magnesium oxide (MAG-OX) 400 (241.3 Mg) MG tablet Take 1 tablet (400 mg total) by mouth daily. 45 tablet 2   oxyCODONE (OXY IR/ROXICODONE) 5 MG immediate release tablet Take 1 tablet (5 mg total) by mouth every 4 (four) hours as needed (pain scale 4-7). 20 tablet 0   Prenatal Vit-Fe Fumarate-FA (PRENATAL MULTIVITAMIN) TABS tablet Take 1 tablet by mouth daily at 12 noon.     No current facility-administered medications for this visit.    Allergies   Allergies as of 08/11/2022 - Review Complete 01/11/2017  Allergen Reaction Noted   Hydrocodone Hives 11/08/2015   Prednisone Nausea And Vomiting 11/08/2015   Rocephin [ceftriaxone] Hives 11/08/2015    Past Medical History   Past Medical History:  Diagnosis Date   Abnormal vaginal bleeding    PCOS (polycystic ovarian syndrome)     Past Surgical History   Past Surgical History:  Procedure Laterality Date   CESAREAN SECTION N/A 01/26/2017   Procedure: CESAREAN SECTION;  Surgeon: Servando Salina, MD;  Location: [facility];  Service: Obstetrics;  Laterality: N/A;   DILATION AND CURETTAGE OF UTERUS      Past Family History   No family history on file.  Past Social History   Social History   Socioeconomic History   Marital status: Married    Spouse name: Not on file   Number of  children: Not on file   Years of education: Not on file   Highest education level: Not on file  Occupational History   Not on file  Tobacco Use   Smoking status: Former    Types: Cigarettes   Smokeless tobacco: Never  Substance and Sexual Activity   Alcohol use: No   Drug use: No   Sexual activity: Not Currently    Birth control/protection: None  Other Topics Concern   Not on file  Social History Narrative   Not on file   Social Determinants of Health   Financial Resource Strain: Not on file  Food Insecurity: Not on file  Transportation Needs: Not on file  Physical Activity: Not on file  Stress: Not on file  Social Connections: Not on file  Intimate Partner Violence: Not on file    Review of Systems   General: Negative for anorexia, weight loss, fever, chills, fatigue, weakness. Eyes: Negative for vision changes.  ENT: Negative for hoarseness, difficulty swallowing , nasal congestion. CV: Negative for chest pain, angina, palpitations, dyspnea on exertion, peripheral edema.  Respiratory: Negative for dyspnea at rest, dyspnea on exertion, cough, sputum, wheezing.  GI: See history of present illness. GU:  Negative for dysuria, hematuria, urinary incontinence, urinary frequency, nocturnal urination.  MS: Negative for joint pain, low back pain.  Derm: Negative for rash or itching.  Neuro: Negative  for weakness, abnormal sensation, seizure, frequent headaches, memory loss,  confusion.  Psych: Negative for anxiety, depression, suicidal ideation, hallucinations.  Endo: Negative for unusual weight change.  Heme: Negative for bruising or bleeding. Allergy: Negative for rash or hives.  Physical Exam   There were no vitals taken for this visit.   General: Well-nourished, well-developed in no acute distress.  Head: Normocephalic, atraumatic.   Eyes: Conjunctiva pink, no icterus. Mouth: Oropharyngeal mucosa moist and pink , no lesions erythema or exudate. Neck: Supple  without thyromegaly, masses, or lymphadenopathy.  Lungs: Clear to auscultation bilaterally.  Heart: Regular rate and rhythm, no murmurs rubs or gallops.  Abdomen: Bowel sounds are normal, nontender, nondistended, no hepatosplenomegaly or masses,  no abdominal bruits or hernia, no rebound or guarding.   Rectal: *** Extremities: No lower extremity edema. No clubbing or deformities.  Neuro: Alert and oriented x 4 , grossly normal neurologically.  Skin: Warm and dry, no rash or jaundice.   Psych: Alert and cooperative, normal mood and affect.  Labs   *** Imaging Studies   No results found.  Assessment       PLAN   ***   Laureen Ochs. Bobby Rumpf, [place], [facility]

## 2022-08-11 ENCOUNTER — Telehealth: Payer: Self-pay | Admitting: *Deleted

## 2022-08-11 ENCOUNTER — Telehealth: Payer: Self-pay | Admitting: Internal Medicine

## 2022-08-11 ENCOUNTER — Ambulatory Visit (INDEPENDENT_AMBULATORY_CARE_PROVIDER_SITE_OTHER): Payer: Medicaid Other | Admitting: Gastroenterology

## 2022-08-11 ENCOUNTER — Encounter: Payer: Self-pay | Admitting: Gastroenterology

## 2022-08-11 VITALS — BP 126/82 | HR 88 | Temp 97.9°F | Ht 66.0 in | Wt 279.2 lb

## 2022-08-11 DIAGNOSIS — R1319 Other dysphagia: Secondary | ICD-10-CM

## 2022-08-11 DIAGNOSIS — K5900 Constipation, unspecified: Secondary | ICD-10-CM | POA: Insufficient documentation

## 2022-08-11 DIAGNOSIS — K5904 Chronic idiopathic constipation: Secondary | ICD-10-CM

## 2022-08-11 DIAGNOSIS — K625 Hemorrhage of anus and rectum: Secondary | ICD-10-CM

## 2022-08-11 DIAGNOSIS — K219 Gastro-esophageal reflux disease without esophagitis: Secondary | ICD-10-CM | POA: Insufficient documentation

## 2022-08-11 MED ORDER — PANTOPRAZOLE SODIUM 40 MG PO TBEC: 40.0000 mg | DELAYED_RELEASE_TABLET | Freq: Every day | ORAL | 5 refills | Status: DC

## 2022-08-11 MED ORDER — LUBIPROSTONE 24 MCG PO CAPS: 24.0000 ug | ORAL_CAPSULE | Freq: Two times a day (BID) | ORAL | 5 refills | Status: DC

## 2022-08-11 NOTE — Telephone Encounter (Signed)
[facility]  TCS/EGD/+/-ED ASA 3 Dr.Carver

## 2022-08-11 NOTE — Patient Instructions (Addendum)
Start pantoprazole 40mg  daily before supper for acid reflux. Start lubiprostone 69mcg once to twice daily with food for constipation.  Colonoscopy and upper endoscopy to be scheduled. See separate instructions.   It was a pleasure to see you today. I want to create trusting relationships with patients and provide genuine, compassionate, and quality care. I truly value your feedback, so please be on the lookout for a survey regarding your visit with me today. I appreciate your time in completing this!

## 2022-08-11 NOTE — Telephone Encounter (Signed)
This patient returned your call... left message on the main number voice mail

## 2022-08-12 ENCOUNTER — Encounter: Payer: Self-pay | Admitting: Gastroenterology

## 2022-08-13 MED ORDER — PEG 3350-KCL-NA BICARB-NACL 420 G PO SOLR: 4000.0000 mL | Freq: Once | ORAL | 0 refills | Status: AC

## 2022-08-13 NOTE — Telephone Encounter (Signed)
Pt returned call. She has been scheduled for 2/22 at 10:30am. Aware will send instructions. Will call back with pre-op appt. Rx for prep sent to pharmacy.

## 2022-08-13 NOTE — Addendum Note (Signed)
Addended by: Cheron Every on: 08/13/2022 08:41 AM   Modules accepted: Orders

## 2022-08-13 NOTE — Telephone Encounter (Signed)
Pt has been scheduled for 08/28/22 with Dr.Carver

## 2022-08-14 ENCOUNTER — Encounter: Payer: Self-pay | Admitting: *Deleted

## 2022-08-21 ENCOUNTER — Ambulatory Visit: Payer: Medicaid Other | Admitting: Nutrition

## 2022-08-25 NOTE — Patient Instructions (Signed)
Nina Sanchez  08/25/2022     @PREFPERIOPPHARMACY$ @   Your procedure is scheduled on 08/28/2022.  Report to [facility] at 8:45 A.M.  Call this number if you have problems the morning of surgery:  [phone]  If you experience any cold or flu symptoms such as cough, fever, chills, shortness of breath, etc. between now and your scheduled surgery, please notify us at the above number.   Remember:   Please Follow the diet and prep instructions given to you by Dr Ave Filter office.     Take these medicines the morning of surgery with A SIP OF WATER : Xanax and Protonix    Do not wear jewelry, make-up or nail polish.  Do not wear lotions, powders, or perfumes, or deodorant.  Do not shave 48 hours prior to surgery.  Men may shave face and neck.  Do not bring valuables to the hospital.  [facility] is not responsible for any belongings or valuables.  Contacts, dentures or bridgework may not be worn into surgery.  Leave your suitcase in the car.  After surgery it may be brought to your room.  For patients admitted to the hospital, discharge time will be determined by your treatment team.  Patients discharged the day of surgery will not be allowed to drive home.   Name and phone number of your driver:   Family Special instructions:  n/a  Please read over the following fact sheets that you were given. Care and Recovery After Surgery  Colonoscopy, Adult A colonoscopy is a procedure to look at the entire large intestine. This procedure is done using a long, thin, flexible tube that has a camera on the end. You may have a colonoscopy: As a part of normal colorectal screening. If you have certain symptoms, such as: A low number of red blood cells in your blood (anemia). Diarrhea that does not go away. Pain in your abdomen. Blood in your stool. A colonoscopy can help screen for and diagnose medical problems, including: An abnormal growth of cells or tissue (tumor). Abnormal  growths within the lining of your intestine (polyps). Inflammation. Areas of bleeding. Tell your health care provider about: Any allergies you have. All medicines you are taking, including vitamins, herbs, eye drops, creams, and over-the-counter medicines. Any problems you or family members have had with anesthetic medicines. Any bleeding problems you have. Any surgeries you have had. Any medical conditions you have. Any problems you have had with having bowel movements. Whether you are pregnant or may be pregnant. What are the risks? Generally, this is a safe procedure. However, problems may occur, including: Bleeding. Damage to your intestine. Allergic reactions to medicines given during the procedure. Infection. This is rare. What happens before the procedure? Eating and drinking restrictions Follow instructions from your health care provider about eating or drinking restrictions, which may include: A few days before the procedure: Follow a low-fiber diet. Avoid nuts, seeds, dried fruit, raw fruits, and vegetables. 1-3 days before the procedure: Eat only gelatin dessert or ice pops. Drink only clear liquids, such as water, clear juice, clear broth or bouillon, black coffee or tea, or clear soft drinks or sports drinks. Avoid liquids that contain red or purple dye. The day of the procedure: Do not eat solid foods. You may continue to drink clear liquids until up to 2 hours before the procedure. Do not eat or drink anything starting 2 hours before the procedure, or within the time period that your health care  provider recommends. Bowel prep If you were prescribed a bowel prep to take by mouth (orally) to clean out your colon: Take it as told by your health care provider. Starting the day before your procedure, you will need to drink a large amount of liquid medicine. The liquid will cause you to have many bowel movements of loose stool until your stool becomes almost clear or light  green. If your skin or the opening between the buttocks (anus) gets irritated from diarrhea, you may relieve the irritation using: Wipes with medicine in them, such as adult wet wipes with aloe and vitamin E. A product to soothe skin, such as petroleum jelly. If you vomit while drinking the bowel prep: Take a break for up to 60 minutes. Begin the bowel prep again. Call your health care provider if you keep vomiting or you cannot take the bowel prep without vomiting. To clean out your colon, you may also be given: Laxative medicines. These help you have a bowel movement. Instructions for enema use. An enema is liquid medicine injected into your rectum. Medicines Ask your health care provider about: Changing or stopping your regular medicines or supplements. This is especially important if you are taking iron supplements, diabetes medicines, or blood thinners. Taking medicines such as aspirin and ibuprofen. These medicines can thin your blood. Do not take these medicines unless your health care provider tells you to take them. Taking over-the-counter medicines, vitamins, herbs, and supplements. General instructions Ask your health care provider what steps will be taken to help prevent infection. These may include washing skin with a germ-killing soap. If you will be going home right after the procedure, plan to have a responsible adult: Take you home from the hospital or clinic. You will not be allowed to drive. Care for you for the time you are told. What happens during the procedure?  An IV will be inserted into one of your veins. You will be given a medicine to make you fall asleep (general anesthetic). You will lie on your side with your knees bent. A lubricant will be put on the tube. Then the tube will be: Inserted into your anus. Gently eased through all parts of your large intestine. Air will be sent into your colon to keep it open. This may cause some pressure or cramping. Images  will be taken with the camera and will appear on a screen. A small tissue sample may be removed to be looked at under a microscope (biopsy). The tissue may be sent to a lab for testing if any signs of problems are found. If small polyps are found, they may be removed and checked for cancer cells. When the procedure is finished, the tube will be removed. The procedure may vary among health care providers and hospitals. What happens after the procedure? Your blood pressure, heart rate, breathing rate, and blood oxygen level will be monitored until you leave the hospital or clinic. You may have a small amount of blood in your stool. You may pass gas and have mild cramping or bloating in your abdomen. This is caused by the air that was used to open your colon during the exam. If you were given a sedative during the procedure, it can affect you for several hours. Do not drive or operate machinery until your health care provider says that it is safe. It is up to you to get the results of your procedure. Ask your health care provider, or the department that is doing the  procedure, when your results will be ready. Summary A colonoscopy is a procedure to look at the entire large intestine. Follow instructions from your health care provider about eating and drinking before the procedure. If you were prescribed an oral bowel prep to clean out your colon, take it as told by your health care provider. During the colonoscopy, a flexible tube with a camera on its end is inserted into the anus and then passed into all parts of the large intestine. This information is not intended to replace advice given to you by your health care provider. Make sure you discuss any questions you have with your health care provider. Document Revised: 06/17/2021 Document Reviewed: 02/13/2021 Elsevier Patient Education  [place] Endoscopy, Adult Upper endoscopy is a procedure to look inside the upper GI  (gastrointestinal) tract. The upper GI tract is made up of: The esophagus. This is the part of the body that moves food from your mouth to your stomach. The stomach. The duodenum. This is the first part of your small intestine. This procedure is also called esophagogastroduodenoscopy (EGD) or gastroscopy. In this procedure, your health care provider passes a thin, flexible tube (endoscope) through your mouth and down your esophagus into your stomach and into your duodenum. A small camera is attached to the end of the tube. Images from the camera appear on a monitor in the exam room. During this procedure, your health care provider may also remove a small piece of tissue to be sent to a lab and examined under a microscope (biopsy). Your health care provider may do an upper endoscopy to diagnose cancers of the upper GI tract. You may also have this procedure to find the cause of other conditions, such as: Stomach pain. Heartburn. Pain or problems when swallowing. Nausea and vomiting. Stomach bleeding. Stomach ulcers. Tell a health care provider about: Any allergies you have. All medicines you are taking, including vitamins, herbs, eye drops, creams, and over-the-counter medicines. Any problems you or family members have had with anesthetic medicines. Any bleeding problems you have. Any surgeries you have had. Any medical conditions you have. Whether you are pregnant or may be pregnant. What are the risks? Your healthcare provider will talk with you about risks. These may include: Infection. Bleeding. Allergic reactions to medicines. A tear or hole (perforation) in the esophagus, stomach, or duodenum. What happens before the procedure? When to stop eating and drinking Follow instructions from your health care provider about what you may eat and drink. These may include: 8 hours before your procedure Stop eating most foods. Do not eat meat, fried foods, or fatty foods. Eat only light  foods, such as toast or crackers. All liquids are okay except energy drinks and alcohol. 6 hours before your procedure Stop eating. Drink only clear liquids, such as water, clear fruit juice, black coffee, plain tea, and sports drinks. Do not drink energy drinks or alcohol. 2 hours before your procedure Stop drinking all liquids. You may be allowed to take medicines with small sips of water. If you do not follow your health care provider's instructions, your procedure may be delayed or canceled. Medicines Ask your health care provider about: Changing or stopping your regular medicines. This is especially important if you are taking diabetes medicines or blood thinners. Taking medicines such as aspirin and ibuprofen. These medicines can thin your blood. Do not take these medicines unless your health care provider tells you to take them. Taking over-the-counter medicines, vitamins, herbs, and  supplements. General instructions If you will be going home right after the procedure, plan to have a responsible adult: Take you home from the hospital or clinic. You will not be allowed to drive. Care for you for the time you are told. What happens during the procedure?  An IV will be inserted into one of your veins. You may be given one or more of the following: A medicine to help you relax (sedative). A medicine to numb the throat (local anesthetic). You will lie on your left side on an exam table. Your health care provider will pass the endoscope through your mouth and down your esophagus. Your health care provider will use the scope to check the inside of your esophagus, stomach, and duodenum. Biopsies may be taken. The endoscope will be removed. The procedure may vary among health care providers and hospitals. What happens after the procedure? Your blood pressure, heart rate, breathing rate, and blood oxygen level will be monitored until you leave the hospital or clinic. When your throat is  no longer numb, you may be given some fluids to drink. If you were given a sedative during the procedure, it can affect you for several hours. Do not drive or operate machinery until your health care provider says that it is safe. It is up to you to get the results of your procedure. Ask your health care provider, or the department that is doing the procedure, when your results will be ready. Contact a health care provider if you: Have a sore throat that lasts longer than 1 day. Have a fever. Get help right away if you: Vomit blood or your vomit looks like coffee grounds. Have bloody, black, or tarry stools. Have a very bad sore throat or you cannot swallow. Have difficulty breathing or very bad pain in your chest or abdomen. These symptoms may be an emergency. Get help right away. Call 911. Do not wait to see if the symptoms will go away. Do not drive yourself to the hospital. Summary Upper endoscopy is a procedure to look inside the upper GI tract. During the procedure, an IV will be inserted into one of your veins. You may be given a medicine to help you relax. The endoscope will be passed through your mouth and down your esophagus. Follow instructions from your health care provider about what you can eat and drink. This information is not intended to replace advice given to you by your health care provider. Make sure you discuss any questions you have with your health care provider. Document Revised: 10/02/2021 Document Reviewed: 10/02/2021 Elsevier Patient Education  [place] Anesthesia refers to the techniques, procedures, and medicines that help a person stay safe and comfortable during surgery. Monitored anesthesia care, or sedation, is one type of anesthesia. You may have sedation if you do not need to be asleep for your procedure. Procedures that use sedation may include: Surgery to remove cataracts from your eyes. A dental procedure. A  biopsy. This is when a tissue sample is removed and looked at under a microscope. You will be watched closely during your procedure. Your level of sedation or type of anesthesia may be changed to fit your needs. Tell a health care provider about: Any allergies you have. All medicines you are taking, including vitamins, herbs, eye drops, creams, and over-the-counter medicines. Any problems you or family members have had with anesthesia. Any bleeding problems you have. Any surgeries you have had. Any medical conditions or  illnesses you have. This includes sleep apnea, cough, fever, or the flu. Whether you are pregnant or may be pregnant. Whether you use cigarettes, alcohol, or drugs. Any use of steroids, whether by mouth or as a cream. What are the risks? Your health care provider will talk with you about risks. These may include: Getting too much medicine (oversedation). Nausea. Allergic reactions to medicines. Trouble breathing. If this happens, a breathing tube may be used to help you breathe. It will be removed when you are awake and breathing on your own. Heart trouble. Lung trouble. Confusion that gets better with time (emergence delirium). What happens before the procedure? When to stop eating and drinking Follow instructions from your health care provider about what you may eat and drink. These may include: 8 hours before your procedure Stop eating most foods. Do not eat meat, fried foods, or fatty foods. Eat only light foods, such as toast or crackers. All liquids are okay except energy drinks and alcohol. 6 hours before your procedure Stop eating. Drink only clear liquids, such as water, clear fruit juice, black coffee, plain tea, and sports drinks. Do not drink energy drinks or alcohol. 2 hours before your procedure Stop drinking all liquids. You may be allowed to take medicines with small sips of water. If you do not follow your health care provider's instructions, your  procedure may be delayed or canceled. Medicines Ask your health care provider about: Changing or stopping your regular medicines. These include any diabetes medicines or blood thinners you take. Taking medicines such as aspirin and ibuprofen. These medicines can thin your blood. Do not take them unless your health care provider tells you to. Taking over-the-counter medicines, vitamins, herbs, and supplements. Testing You may have an exam or testing. You may have a blood or urine sample taken. General instructions Do not use any products that contain nicotine or tobacco for at least 4 weeks before the procedure. These products include cigarettes, chewing tobacco, and vaping devices, such as e-cigarettes. If you need help quitting, ask your health care provider. If you will be going home right after the procedure, plan to have a responsible adult: Take you home from the hospital or clinic. You will not be allowed to drive. Care for you for the time you are told. What happens during the procedure?  Your blood pressure, heart rate, breathing, level of pain, and blood oxygen level will be monitored. An IV will be inserted into one of your veins. You may be given: A sedative. This helps you relax. Anesthesia. This will: Numb certain areas of your body. Make you fall asleep for surgery. You will be given medicines as needed to keep you comfortable. The more medicine you are given, the deeper your level of sedation will be. Your level of sedation may be changed to fit your needs. There are three levels of sedation: Mild sedation. At this level, you may feel awake and relaxed. You will be able to follow directions. Moderate sedation. At this level, you will be sleepy. You may not remember the procedure. Deep sedation. At this level, you will be asleep. You will not remember the procedure. How you get the medicines will depend on your age and the procedure. They may be given as: A pill. This may be  taken by mouth (orally) or inserted into the rectum. An injection. This may be into a vein or muscle. A spray through the nose. After your procedure is over, the medicine will be stopped. The procedure  may vary among health care providers and hospitals. What happens after the procedure? Your blood pressure, heart rate, breathing rate, and blood oxygen level will be monitored until you leave the hospital or clinic. You may feel sleepy, clumsy, or nauseous. You may not remember what happened during or after the procedure. Sedation can affect you for several hours. Do not drive or use machinery until your health care provider says that it is safe. This information is not intended to replace advice given to you by your health care provider. Make sure you discuss any questions you have with your health care provider. Document Revised: 11/18/2021 Document Reviewed: 11/18/2021 Elsevier Patient Education  [place].

## 2022-08-26 ENCOUNTER — Encounter (HOSPITAL_COMMUNITY): Payer: Self-pay

## 2022-08-26 ENCOUNTER — Encounter (HOSPITAL_COMMUNITY)
Admission: RE | Admit: 2022-08-26 | Discharge: 2022-08-26 | Disposition: A | Discharge status: Home / Self Care | Payer: Medicaid Other | Source: Ambulatory Visit

## 2022-08-26 ENCOUNTER — Encounter (INDEPENDENT_AMBULATORY_CARE_PROVIDER_SITE_OTHER): Payer: Self-pay | Admitting: *Deleted

## 2022-08-26 ENCOUNTER — Telehealth (INDEPENDENT_AMBULATORY_CARE_PROVIDER_SITE_OTHER): Payer: Self-pay | Admitting: *Deleted

## 2022-08-26 VITALS — BP 131/76 | HR 85 | Temp 97.9°F | Resp 18 | Ht 66.0 in | Wt 279.2 lb

## 2022-08-26 DIAGNOSIS — I10 Essential (primary) hypertension: Secondary | ICD-10-CM

## 2022-08-26 DIAGNOSIS — E669 Obesity, unspecified: Secondary | ICD-10-CM

## 2022-08-26 DIAGNOSIS — Z01810 Encounter for preprocedural cardiovascular examination: Secondary | ICD-10-CM | POA: Insufficient documentation

## 2022-08-26 DIAGNOSIS — Z01818 Encounter for other preprocedural examination: Secondary | ICD-10-CM

## 2022-08-26 LAB — POCT PREGNANCY, URINE: Preg Test, Ur: NEGATIVE

## 2022-08-26 NOTE — Progress Notes (Signed)
Patient procedure cancelled due to positive covid test. She can be rescheduled 11 days after positive result which was 08/18/2022.

## 2022-08-26 NOTE — Telephone Encounter (Signed)
Received message from endo patient procedure for 2/22 will be cancelled due to patient being covid +.  Called pt and she tested positive 2/19. Pt has been rescheduled to 3/25 at 730am. Aware will send new instructions and let know if she needs new pre-op appt.

## 2022-09-09 ENCOUNTER — Ambulatory Visit: Payer: Medicaid Other | Admitting: Nutrition

## 2022-09-25 ENCOUNTER — Other Ambulatory Visit: Payer: Self-pay

## 2022-09-25 ENCOUNTER — Encounter (HOSPITAL_COMMUNITY): Payer: Self-pay

## 2022-09-25 ENCOUNTER — Encounter (HOSPITAL_COMMUNITY)
Admission: RE | Admit: 2022-09-25 | Discharge: 2022-09-25 | Disposition: A | Discharge status: Home / Self Care | Payer: Medicaid Other | Source: Ambulatory Visit | Attending: Internal Medicine | Admitting: Internal Medicine

## 2022-09-25 VITALS — Ht 66.0 in | Wt 279.1 lb

## 2022-09-25 DIAGNOSIS — Z01818 Encounter for other preprocedural examination: Secondary | ICD-10-CM

## 2022-09-25 NOTE — Pre-Procedure Instructions (Signed)
Attempted pre-op phone call. Left Vm for her to call us back. 

## 2022-09-26 ENCOUNTER — Telehealth (INDEPENDENT_AMBULATORY_CARE_PROVIDER_SITE_OTHER): Payer: Self-pay | Admitting: *Deleted

## 2022-09-26 ENCOUNTER — Encounter: Payer: Self-pay | Admitting: *Deleted

## 2022-09-26 NOTE — Telephone Encounter (Signed)
-----   Message from Josue Hector sent at 09/26/2022  9:14 AM EDT ----- This patient was 1st case for Monday, she wants to reschedule.  She said she called the office but they told her to call me.  I put her in the depot and advised her to call your office back to reschedule.  Thanks,

## 2022-09-26 NOTE — Telephone Encounter (Signed)
Pt has been rescheduled for 10/06/22 at 2 pm. New instructions printed placed up front to pick up along with cancellation letter.

## 2022-10-01 ENCOUNTER — Other Ambulatory Visit (HOSPITAL_BASED_OUTPATIENT_CLINIC_OR_DEPARTMENT_OTHER): Payer: Self-pay

## 2022-10-01 ENCOUNTER — Encounter (HOSPITAL_COMMUNITY)
Admission: RE | Admit: 2022-10-01 | Discharge: 2022-10-01 | Disposition: A | Discharge status: Home / Self Care | Payer: Medicaid Other | Source: Ambulatory Visit | Attending: Internal Medicine | Admitting: Internal Medicine

## 2022-10-01 ENCOUNTER — Encounter (HOSPITAL_COMMUNITY): Payer: Self-pay

## 2022-10-01 DIAGNOSIS — R0683 Snoring: Secondary | ICD-10-CM

## 2022-10-01 DIAGNOSIS — R5383 Other fatigue: Secondary | ICD-10-CM

## 2022-10-01 HISTORY — DX: Gastro-esophageal reflux disease without esophagitis: K21.9

## 2022-10-06 ENCOUNTER — Ambulatory Visit (HOSPITAL_COMMUNITY): Payer: Medicaid Other | Admitting: Anesthesiology

## 2022-10-06 ENCOUNTER — Encounter (HOSPITAL_COMMUNITY)
Admission: RE | Disposition: A | Discharge status: Home / Self Care | Payer: Self-pay | Source: Home / Self Care | Attending: Internal Medicine

## 2022-10-06 ENCOUNTER — Ambulatory Visit (HOSPITAL_COMMUNITY)
Admission: RE | Admit: 2022-10-06 | Discharge: 2022-10-06 | Disposition: A | Discharge status: Home / Self Care | Payer: Medicaid Other | Attending: Internal Medicine | Admitting: Internal Medicine

## 2022-10-06 ENCOUNTER — Ambulatory Visit (HOSPITAL_BASED_OUTPATIENT_CLINIC_OR_DEPARTMENT_OTHER): Payer: Medicaid Other | Admitting: Anesthesiology

## 2022-10-06 DIAGNOSIS — D124 Benign neoplasm of descending colon: Secondary | ICD-10-CM

## 2022-10-06 DIAGNOSIS — R12 Heartburn: Secondary | ICD-10-CM | POA: Insufficient documentation

## 2022-10-06 DIAGNOSIS — K219 Gastro-esophageal reflux disease without esophagitis: Secondary | ICD-10-CM | POA: Diagnosis not present

## 2022-10-06 DIAGNOSIS — F419 Anxiety disorder, unspecified: Secondary | ICD-10-CM | POA: Diagnosis not present

## 2022-10-06 DIAGNOSIS — K635 Polyp of colon: Secondary | ICD-10-CM | POA: Insufficient documentation

## 2022-10-06 DIAGNOSIS — Z6841 Body Mass Index (BMI) 40.0 and over, adult: Secondary | ICD-10-CM | POA: Insufficient documentation

## 2022-10-06 DIAGNOSIS — K6289 Other specified diseases of anus and rectum: Secondary | ICD-10-CM

## 2022-10-06 DIAGNOSIS — Z01818 Encounter for other preprocedural examination: Secondary | ICD-10-CM

## 2022-10-06 DIAGNOSIS — K625 Hemorrhage of anus and rectum: Secondary | ICD-10-CM | POA: Insufficient documentation

## 2022-10-06 DIAGNOSIS — K648 Other hemorrhoids: Secondary | ICD-10-CM | POA: Diagnosis not present

## 2022-10-06 DIAGNOSIS — K2970 Gastritis, unspecified, without bleeding: Secondary | ICD-10-CM

## 2022-10-06 DIAGNOSIS — R1310 Dysphagia, unspecified: Secondary | ICD-10-CM | POA: Diagnosis not present

## 2022-10-06 DIAGNOSIS — Z87891 Personal history of nicotine dependence: Secondary | ICD-10-CM | POA: Diagnosis not present

## 2022-10-06 DIAGNOSIS — K2950 Unspecified chronic gastritis without bleeding: Secondary | ICD-10-CM | POA: Diagnosis not present

## 2022-10-06 HISTORY — PX: ESOPHAGOGASTRODUODENOSCOPY (EGD) WITH PROPOFOL: SHX5813

## 2022-10-06 HISTORY — PX: COLONOSCOPY WITH PROPOFOL: SHX5780

## 2022-10-06 HISTORY — PX: BIOPSY: SHX5522

## 2022-10-06 HISTORY — PX: POLYPECTOMY: SHX5525

## 2022-10-06 LAB — POCT PREGNANCY, URINE: Preg Test, Ur: NEGATIVE

## 2022-10-06 SURGERY — COLONOSCOPY WITH PROPOFOL
Anesthesia: General

## 2022-10-06 MED ORDER — PROPOFOL 500 MG/50ML IV EMUL
INTRAVENOUS | Status: DC | PRN
  Administered 2022-10-06: 180 ug/kg/min via INTRAVENOUS

## 2022-10-06 MED ORDER — LIDOCAINE HCL (PF) 2 % IJ SOLN
INTRAMUSCULAR | Status: DC | PRN
  Administered 2022-10-06: 50 mg via INTRADERMAL

## 2022-10-06 MED ORDER — LIDOCAINE HCL (PF) 2 % IJ SOLN
INTRAMUSCULAR | Status: AC
  Filled 2022-10-06: qty 5

## 2022-10-06 MED ORDER — LACTATED RINGERS IV SOLN: INTRAVENOUS | Status: DC | PRN

## 2022-10-06 MED ORDER — PROPOFOL 10 MG/ML IV BOLUS
INTRAVENOUS | Status: DC | PRN
  Administered 2022-10-06: 10 mg via INTRAVENOUS
  Administered 2022-10-06: 150 mg via INTRAVENOUS

## 2022-10-06 MED ORDER — PANTOPRAZOLE SODIUM 40 MG PO TBEC: 40.0000 mg | DELAYED_RELEASE_TABLET | Freq: Two times a day (BID) | ORAL | 11 refills | Status: AC

## 2022-10-06 MED ORDER — PROPOFOL 500 MG/50ML IV EMUL
INTRAVENOUS | Status: AC
  Filled 2022-10-06: qty 50

## 2022-10-06 MED ORDER — GLYCOPYRROLATE PF 0.2 MG/ML IJ SOSY
PREFILLED_SYRINGE | INTRAMUSCULAR | Status: DC | PRN
  Administered 2022-10-06: .2 mg via INTRAVENOUS

## 2022-10-06 MED ORDER — GLYCOPYRROLATE PF 0.2 MG/ML IJ SOSY
PREFILLED_SYRINGE | INTRAMUSCULAR | Status: AC
  Filled 2022-10-06: qty 1

## 2022-10-06 NOTE — Transfer of Care (Signed)
Immediate Anesthesia Transfer of Care Note  Patient: Nina Sanchez  Procedure(s) Performed: COLONOSCOPY WITH PROPOFOL ESOPHAGOGASTRODUODENOSCOPY (EGD) WITH PROPOFOL BIOPSY POLYPECTOMY  Patient Location: Short Stay  Anesthesia Type:General  Level of Consciousness: drowsy, patient cooperative, and responds to stimulation  Airway & Oxygen Therapy: Patient Spontanous Breathing  Post-op Assessment: Report given to RN, Post -op Vital signs reviewed and stable, and Patient moving all extremities X 4  Post vital signs: Reviewed and stable  Last Vitals:  Vitals Value Taken Time  BP 110/57   Temp 97.4   Pulse 94   Resp 14   SpO2 99     Last Pain:  Vitals:   10/06/22 1134  PainSc: 10-Worst pain ever         Complications: No notable events documented.

## 2022-10-06 NOTE — Discharge Instructions (Signed)
EGD Discharge instructions Please read the instructions outlined below and refer to this sheet in the next few weeks. These discharge instructions provide you with general information on caring for yourself after you leave the hospital. Your doctor may also give you specific instructions. While your treatment has been planned according to the most current medical practices available, unavoidable complications occasionally occur. If you have any problems or questions after discharge, please call your doctor. ACTIVITY You may resume your regular activity but move at a slower pace for the next 24 hours.  Take frequent rest periods for the next 24 hours.  Walking will help expel (get rid of) the air and reduce the bloated feeling in your abdomen.  No driving for 24 hours (because of the anesthesia (medicine) used during the test).  You may shower.  Do not sign any important legal documents or operate any machinery for 24 hours (because of the anesthesia used during the test).  NUTRITION Drink plenty of fluids.  You may resume your normal diet.  Begin with a light meal and progress to your normal diet.  Avoid alcoholic beverages for 24 hours or as instructed by your caregiver.  MEDICATIONS You may resume your normal medications unless your caregiver tells you otherwise.  WHAT YOU CAN EXPECT TODAY You may experience abdominal discomfort such as a feeling of fullness or "gas" pains.  FOLLOW-UP Your doctor will discuss the results of your test with you.  SEEK IMMEDIATE MEDICAL ATTENTION IF ANY OF THE FOLLOWING OCCUR: Excessive nausea (feeling sick to your stomach) and/or vomiting.  Severe abdominal pain and distention (swelling).  Trouble swallowing.  Temperature over 101 F (37.8 C).  Rectal bleeding or vomiting of blood.     Colonoscopy Discharge Instructions  Read the instructions outlined below and refer to this sheet in the next few weeks. These discharge instructions provide you with  general information on caring for yourself after you leave the hospital. Your doctor may also give you specific instructions. While your treatment has been planned according to the most current medical practices available, unavoidable complications occasionally occur.   ACTIVITY You may resume your regular activity, but move at a slower pace for the next 24 hours.  Take frequent rest periods for the next 24 hours.  Walking will help get rid of the air and reduce the bloated feeling in your belly (abdomen).  No driving for 24 hours (because of the medicine (anesthesia) used during the test).   Do not sign any important legal documents or operate any machinery for 24 hours (because of the anesthesia used during the test).  NUTRITION Drink plenty of fluids.  You may resume your normal diet as instructed by your doctor.  Begin with a light meal and progress to your normal diet. Heavy or fried foods are harder to digest and may make you feel sick to your stomach (nauseated).  Avoid alcoholic beverages for 24 hours or as instructed.  MEDICATIONS You may resume your normal medications unless your doctor tells you otherwise.  WHAT YOU CAN EXPECT TODAY Some feelings of bloating in the abdomen.  Passage of more gas than usual.  Spotting of blood in your stool or on the toilet paper.  IF YOU HAD POLYPS REMOVED DURING THE COLONOSCOPY: No aspirin products for 7 days or as instructed.  No alcohol for 7 days or as instructed.  Eat a soft diet for the next 24 hours.  FINDING OUT THE RESULTS OF YOUR TEST Not all test results are  available during your visit. If your test results are not back during the visit, make an appointment with your caregiver to find out the results. Do not assume everything is normal if you have not heard from your caregiver or the medical facility. It is important for you to follow up on all of your test results.  SEEK IMMEDIATE MEDICAL ATTENTION IF: You have more than a spotting of  blood in your stool.  Your belly is swollen (abdominal distention).  You are nauseated or vomiting.  You have a temperature over 101.  You have abdominal pain or discomfort that is severe or gets worse throughout the day.   Your EGD revealed mild amount inflammation in your stomach.  I took biopsies of this to rule out infection with a bacteria called H. pylori.  Await pathology results, my office will contact you.  I did not find any tightenings or strictures of your esophagus..  I did not need to dilate today.  Small bowel appeared normal.  I am going to increase your pantoprazole to twice daily for the next 12 weeks at which point you can decrease it back down to once daily.  Your colonoscopy revealed 2 polyp(s) which I removed successfully. Await pathology results, my office will contact you.  These are likely benign, hyperplastic.  Recommend repeat colonoscopy in 7 to 10 years depending on pathology results.  You also have internal hemorrhoids. I would recommend increasing fiber in your diet or adding OTC Benefiber/Metamucil. Be sure to drink at least 4 to 6 glasses of water daily.  Also with evidence of an anal papillae which I biopsied as well.  We can set you up for hemorrhoid banding for treatment purposes if bleeding continues.  Follow-up with GI in 3 months.   I hope you have a great rest of your week!  Elon Alas. Abbey Chatters, D.O. Gastroenterology and Hepatology [facility]

## 2022-10-06 NOTE — Anesthesia Preprocedure Evaluation (Signed)
Anesthesia Evaluation  Patient identified by MRN, date of birth, ID band Patient awake    Reviewed: Allergy & Precautions, H&P , NPO status , Patient's Chart, lab work & pertinent test results, reviewed documented beta blocker date and time   Airway Mallampati: II  TM Distance: >3 FB Neck ROM: full    Dental no notable dental hx.    Pulmonary neg pulmonary ROS, former smoker   Pulmonary exam normal breath sounds clear to auscultation       Cardiovascular Exercise Tolerance: Good negative cardio ROS  Rhythm:regular Rate:Normal     Neuro/Psych   Anxiety     negative neurological ROS  negative psych ROS   GI/Hepatic Neg liver ROS,GERD  Medicated,,  Endo/Other    Morbid obesity  Renal/GU negative Renal ROS  negative genitourinary   Musculoskeletal   Abdominal   Peds  Hematology negative hematology ROS (+)   Anesthesia Other Findings   Reproductive/Obstetrics negative OB ROS                             Anesthesia Physical Anesthesia Plan  ASA: 3  Anesthesia Plan: General   Post-op Pain Management:    Induction:   PONV Risk Score and Plan: Propofol infusion  Airway Management Planned:   Additional Equipment:   Intra-op Plan:   Post-operative Plan:   Informed Consent: I have reviewed the patients History and Physical, chart, labs and discussed the procedure including the risks, benefits and alternatives for the proposed anesthesia with the patient or authorized representative who has indicated his/her understanding and acceptance.     Dental Advisory Given  Plan Discussed with: CRNA  Anesthesia Plan Comments:        Anesthesia Quick Evaluation

## 2022-10-06 NOTE — H&P (Signed)
Primary Care Physician:  Janine Limbo, PA-C Primary Gastroenterologist:  Dr. Abbey Chatters  Pre-Procedure History & Physical: HPI:  Nina Sanchez is a 42 y.o. female is  here for an EGD with possible dilation due to chronic GERD, dysphagia, and colonoscopy for rectal bleeding.  Past Medical History:  Diagnosis Date   Abnormal vaginal bleeding    Anxiety    GERD (gastroesophageal reflux disease)    Obesity    PCOS (polycystic ovarian syndrome)     Past Surgical History:  Procedure Laterality Date   CESAREAN SECTION N/A 01/26/2017   Procedure: CESAREAN SECTION;  Surgeon: Servando Salina, MD;  Location: [place];  Service: Obstetrics;  Laterality: N/A;   DILATION AND CURETTAGE OF UTERUS      Prior to Admission medications   Medication Sig Start Date End Date Taking? Authorizing Provider  acetaminophen (TYLENOL) 500 MG tablet Take 500 mg by mouth every 6 (six) hours as needed.   Yes [provider]  ALPRAZolam Duanne Moron) 0.5 MG tablet Take 0.5 mg by mouth every 12 (twelve) hours as needed for anxiety.   Yes [provider]  desvenlafaxine (PRISTIQ) 50 MG 24 hr tablet Take 50 mg by mouth at bedtime.   Yes [provider]  lubiprostone (AMITIZA) 24 MCG capsule Take 1 capsule (24 mcg total) by mouth 2 (two) times daily with a meal. 08/11/22  Yes Mahala Menghini, PA-C  pantoprazole (PROTONIX) 40 MG tablet Take 1 tablet (40 mg total) by mouth daily before supper. 08/11/22  Yes Mahala Menghini, PA-C  Vitamin D, Ergocalciferol, (DRISDOL) 1.25 MG (50000 UNIT) CAPS capsule Take 50,000 Units by mouth every Monday.   Yes [provider]    Allergies as of 08/13/2022 - Review Complete 08/11/2022  Allergen Reaction Noted   Hydrocodone Hives 11/08/2015   Prednisone Nausea And Vomiting 11/08/2015   Rocephin [ceftriaxone] Hives 11/08/2015   Oxycodone Itching 10/24/2011    Family History  Problem Relation Age of Onset   Colon cancer Neg Hx      Social History   Socioeconomic History   Marital status: Married    Spouse name: Not on file   Number of children: Not on file   Years of education: Not on file   Highest education level: Not on file  Occupational History   Not on file  Tobacco Use   Smoking status: Former    Types: Cigarettes   Smokeless tobacco: Never  Substance and Sexual Activity   Alcohol use: No   Drug use: No   Sexual activity: Yes    Birth control/protection: None  Other Topics Concern   Not on file  Social History Narrative   Not on file   Social Determinants of Health   Financial Resource Strain: Not on file  Food Insecurity: Not on file  Transportation Needs: Not on file  Physical Activity: Not on file  Stress: Not on file  Social Connections: Not on file  Intimate Partner Violence: Not on file    Review of Systems: General: Negative for fever, chills, fatigue, weakness. Eyes: Negative for vision changes.  ENT: Negative for hoarseness, difficulty swallowing , nasal congestion. CV: Negative for chest pain, angina, palpitations, dyspnea on exertion, peripheral edema.  Respiratory: Negative for dyspnea at rest, dyspnea on exertion, cough, sputum, wheezing.  GI: See history of present illness. GU:  Negative for dysuria, hematuria, urinary incontinence, urinary frequency, nocturnal urination.  MS: Negative for joint pain, low back pain.  Derm: Negative for rash or  itching.  Neuro: Negative for weakness, abnormal sensation, seizure, frequent headaches, memory loss, confusion.  Psych: Negative for anxiety, depression Endo: Negative for unusual weight change.  Heme: Negative for bruising or bleeding. Allergy: Negative for rash or hives.  Physical Exam: Vital signs in last 24 hours: Temp:  [97.7 F (36.5 C)] 97.7 F (36.5 C) (04/01 0945) Pulse Rate:  [67-75] 71 (04/01 1030) Resp:  [10-13] 11 (04/01 1030) BP: (128-143)/(57-82) 134/57 (04/01 1030) SpO2:  [96 %-98 %] 97 % (04/01 1030)    General:   Alert,  Well-developed, well-nourished, pleasant and cooperative in NAD Head:  Normocephalic and atraumatic. Eyes:  Sclera clear, no icterus.   Conjunctiva pink. Ears:  Normal auditory acuity. Nose:  No deformity, discharge,  or lesions. Msk:  Symmetrical without gross deformities. Normal posture. Extremities:  Without clubbing or edema. Neurologic:  Alert and  oriented x4;  grossly normal neurologically. Skin:  Intact without significant lesions or rashes. Psych:  Alert and cooperative. Normal mood and affect.   Impression/Plan: Nina Sanchez is here for an EGD with possible dilation due to chronic GERD, dysphagia, and colonoscopy for rectal bleeding.  Risks, benefits, limitations, imponderables and alternatives regarding EGD have been reviewed with the patient. Questions have been answered. All parties agreeable.

## 2022-10-06 NOTE — Op Note (Signed)
[facility] Patient Name: Nina Sanchez Procedure Date: 10/06/2022 11:24 AM MRN: [id number] Date of Birth: 1981-02-19 Attending MD: Elon Alas. Abbey Chatters , [place], [id number] CSN: [id number] Age: 42 Admit Type: Outpatient Procedure:                Colonoscopy Indications:              Rectal bleeding Providers:                Elon Alas. Abbey Chatters, DO, Lambert Mody, Raphael Gibney, Technician Referring MD:              Medicines:                See the Anesthesia note for documentation of the                            administered medications Complications:            No immediate complications. Estimated Blood Loss:     Estimated blood loss was minimal. Procedure:                Pre-Anesthesia Assessment:                           - The anesthesia plan was to use monitored                            anesthesia care (MAC).                           After obtaining informed consent, the colonoscope                            was passed under direct vision. Throughout the                            procedure, the patient's blood pressure, pulse, and                            oxygen saturations were monitored continuously. The                            [id number] scope was introduced through                            the anus and advanced to the the terminal ileum,                            with identification of the appendiceal orifice and                            IC valve. The colonoscopy was performed without                            difficulty. The patient tolerated the procedure  well. The quality of the bowel preparation was                            evaluated using the BBPS [facility] Bowel Preparation                            Scale) with scores of: Right Colon = 3, Transverse                            Colon = 3 and Left Colon = 3 (entire mucosa seen                            well with no residual  staining, small fragments of                            stool or opaque liquid). The total BBPS score                            equals 9. Scope In: 11:47:16 AM Scope Out: 11:59:41 AM Scope Withdrawal Time: 0 hours 11 minutes 21 seconds  Total Procedure Duration: 0 hours 12 minutes 25 seconds  Findings:      Non-bleeding internal hemorrhoids were found during retroflexion.      Anal papilla(e) were hypertrophied. Biopsies were taken with a cold       forceps for histology.      Two sessile polyps were found in the descending colon. The polyps were 4       to 5 mm in size. These polyps were removed with a cold snare. Resection       and retrieval were complete.      The terminal ileum appeared normal.      The exam was otherwise without abnormality. Impression:               - Non-bleeding internal hemorrhoids.                           - Anal papilla(e) were hypertrophied. Biopsied.                           - Two 4 to 5 mm polyps in the descending colon,                            removed with a cold snare. Resected and retrieved.                           - The examined portion of the ileum was normal.                           - The examination was otherwise normal. Moderate Sedation:      Per Anesthesia Care Recommendation:           - Patient has a contact number available for                            emergencies. The signs and  symptoms of potential                            delayed complications were discussed with the                            patient. Return to normal activities tomorrow.                            Written discharge instructions were provided to the                            patient.                           - Resume previous diet.                           - Continue present medications.                           - Await pathology results.                           - Repeat colonoscopy in 7-10 years for surveillance.                           - Return  to GI clinic in 3 months.                           - Consider hemorrhoid banding if bleeding continues Procedure Code(s):        --- Professional ---                           [phone], Colonoscopy, flexible; with removal of                            tumor(s), polyp(s), or other lesion(s) by snare                            technique                           45380, 3, Colonoscopy, flexible; with biopsy,                            single or multiple Diagnosis Code(s):        --- Professional ---                           K62.89, Other specified diseases of anus and rectum                           D12.4, Benign neoplasm of descending colon                           K64.8, Other hemorrhoids  K62.5, Hemorrhage of anus and rectum CPT copyright 2022 American Medical Association. All rights reserved. The codes documented in this report are preliminary and upon coder review may  be revised to meet current compliance requirements. Elon Alas. Abbey Chatters, DO [place] Abbey Chatters, DO 10/06/2022 12:02:34 PM This report has been signed electronically. Number of Addenda: 0

## 2022-10-06 NOTE — Anesthesia Procedure Notes (Signed)
Procedure Name: General with mask airway Date/Time: 10/06/2022 11:32 AM  Performed by: Maude Leriche, CRNAPre-anesthesia Checklist: Patient identified, Emergency Drugs available, Suction available, Patient being monitored and Timeout performed Patient Re-evaluated:Patient Re-evaluated prior to induction Oxygen Delivery Method: Nasal cannula Preoxygenation: Pre-oxygenation with 100% oxygen Dental Injury: Teeth and Oropharynx as per pre-operative assessment

## 2022-10-06 NOTE — Op Note (Signed)
[facility] Patient Name: Nina Sanchez Procedure Date: 10/06/2022 11:25 AM MRN: [id number] Date of Birth: 1980/11/19 Attending MD: Elon Alas. Abbey Chatters , [place], [id number] CSN: [id number] Age: 42 Admit Type: Outpatient Procedure:                Upper GI endoscopy Indications:              Dysphagia, Heartburn Providers:                Elon Alas. Abbey Chatters, DO, Lambert Mody, Raphael Gibney, Technician Referring MD:              Medicines:                See the Anesthesia note for documentation of the                            administered medications Complications:            No immediate complications. Estimated Blood Loss:     Estimated blood loss was minimal. Procedure:                Pre-Anesthesia Assessment:                           - The anesthesia plan was to use monitored                            anesthesia care (MAC).                           After obtaining informed consent, the endoscope was                            passed under direct vision. Throughout the                            procedure, the patient's blood pressure, pulse, and                            oxygen saturations were monitored continuously. The                            [id number] scope was introduced through the                            mouth, and advanced to the second part of duodenum.                            The upper GI endoscopy was accomplished without                            difficulty. The patient tolerated the procedure                            well. Scope In: 11:39:40 AM Scope  Out: 11:42:49 AM Total Procedure Duration: 0 hours 3 minutes 9 seconds  Findings:      The Z-line was regular and was found 38 cm from the incisors.      There is no endoscopic evidence of stenosis or stricture in the entire       esophagus.      Patchy mild inflammation characterized by erythema was found in the       gastric body. Biopsies were taken with  a cold forceps for Helicobacter       pylori testing.      The duodenal bulb, first portion of the duodenum and second portion of       the duodenum were normal. Impression:               - Z-line regular, 38 cm from the incisors.                           - Gastritis. Biopsied.                           - Normal duodenal bulb, first portion of the                            duodenum and second portion of the duodenum. Moderate Sedation:      Per Anesthesia Care Recommendation:           - Patient has a contact number available for                            emergencies. The signs and symptoms of potential                            delayed complications were discussed with the                            patient. Return to normal activities tomorrow.                            Written discharge instructions were provided to the                            patient.                           - Resume previous diet.                           - Continue present medications.                           - Await pathology results.                           - Use Protonix (pantoprazole) 40 mg PO BID for 12                            weeks.                           -  Return to GI clinic in 3 months. Procedure Code(s):        --- Professional ---                           [phone], Esophagogastroduodenoscopy, flexible,                            transoral; with biopsy, single or multiple Diagnosis Code(s):        --- Professional ---                           K29.70, Gastritis, unspecified, without bleeding                           R13.10, Dysphagia, unspecified                           R12, Heartburn CPT copyright 2022 American Medical Association. All rights reserved. The codes documented in this report are preliminary and upon coder review may  be revised to meet current compliance requirements. Elon Alas. Abbey Chatters, DO [place] Abbey Chatters, DO 10/06/2022 11:45:17 AM This report has been signed  electronically. Number of Addenda: 0

## 2022-10-08 LAB — SURGICAL PATHOLOGY

## 2022-10-10 ENCOUNTER — Other Ambulatory Visit: Payer: Self-pay | Admitting: Internal Medicine

## 2022-10-10 MED ORDER — TETRACYCLINE HCL 500 MG PO CAPS: 500.0000 mg | ORAL_CAPSULE | Freq: Four times a day (QID) | ORAL | 0 refills | Status: AC

## 2022-10-10 MED ORDER — METRONIDAZOLE 500 MG PO TABS: 500.0000 mg | ORAL_TABLET | Freq: Three times a day (TID) | ORAL | 0 refills | Status: AC

## 2022-10-10 MED ORDER — BISMUTH 262 MG PO CHEW: 2.0000 | CHEWABLE_TABLET | Freq: Four times a day (QID) | ORAL | 0 refills | Status: AC

## 2022-10-10 NOTE — Anesthesia Postprocedure Evaluation (Signed)
Anesthesia Post Note  Patient: Nina Sanchez  Procedure(s) Performed: COLONOSCOPY WITH PROPOFOL ESOPHAGOGASTRODUODENOSCOPY (EGD) WITH PROPOFOL BIOPSY POLYPECTOMY  Patient location during evaluation: Phase II Anesthesia Type: General Level of consciousness: awake Pain management: pain level controlled Vital Signs Assessment: post-procedure vital signs reviewed and stable Respiratory status: spontaneous breathing and respiratory function stable Cardiovascular status: blood pressure returned to baseline and stable Postop Assessment: no headache and no apparent nausea or vomiting Anesthetic complications: no Comments: Late entry   No notable events documented.   Last Vitals:  Vitals:   10/06/22 1030 10/06/22 1204  BP: (!) 134/57 (!) 110/57  Pulse: 71 91  Resp: 11 (!) 22  Temp:  (!) 36.3 C  SpO2: 97%     Last Pain:  Vitals:   10/07/22 1020  TempSrc:   PainSc: 0-No pain                 Windell Norfolk

## 2022-10-13 ENCOUNTER — Encounter (HOSPITAL_COMMUNITY): Payer: Self-pay | Admitting: Internal Medicine

## 2022-10-13 ENCOUNTER — Other Ambulatory Visit: Payer: Self-pay

## 2022-10-13 ENCOUNTER — Telehealth: Payer: Self-pay

## 2022-10-13 DIAGNOSIS — B9681 Helicobacter pylori [H. pylori] as the cause of diseases classified elsewhere: Secondary | ICD-10-CM

## 2022-10-13 MED ORDER — BISMUTH/METRONIDAZ/TETRACYCLIN 140-125-125 MG PO CAPS
3.0000 | ORAL_CAPSULE | Freq: Four times a day (QID) | ORAL | 0 refills | Status: DC
Start: 2022-10-13 — End: 2023-01-06

## 2022-10-13 NOTE — Telephone Encounter (Signed)
PA done for Subcit-Metronid-Tetracyc 140-125-125mg  caps on Cover My Meds. Dx used: B96.81 (H Pylori gastritis). Waiting on a response

## 2022-10-13 NOTE — Telephone Encounter (Signed)
Pt approved for the H Pylori medication. Pt is approved 10/13/2022 through 07/07/2023. Phoned and advised the pt of this. Will give to Darl Pikes to scan to chart.

## 2022-10-15 NOTE — Telephone Encounter (Signed)
Phoned [facility] in [place] and spoke with the pharmacist. They do have the pt's Rx's available and ready for pickup. All she has to do is buy the Bismuth OTC and other 2 Rx's are $4.00 apiece.   Phoned the pt was sent to her vm (after the pt called me regarding this) and advised her what the pharmacist told me

## 2022-10-27 ENCOUNTER — Ambulatory Visit: Payer: Medicaid Other | Admitting: Pulmonary Disease

## 2022-10-27 DIAGNOSIS — R0683 Snoring: Secondary | ICD-10-CM

## 2022-10-27 DIAGNOSIS — R5383 Other fatigue: Secondary | ICD-10-CM | POA: Insufficient documentation

## 2022-10-31 NOTE — Procedures (Signed)
     Patient Name: Nina Sanchez, Nina Sanchez Date: 10/27/2022 Gender: Female D.O.B: 1980-08-29 Age (years): 63 Referring Provider: Wille Glaser Height (inches): 66 Interpreting Physician: Coralyn Helling MD, ABSM Weight (lbs): 279 RPSGT: Alfonso Ellis BMI: 45 MRN: [id number]  CLINICAL INFORMATION Sleep Study Type: HST  Indication for sleep study: snoring, sleep disruption and daytime sleepiness.  SLEEP STUDY TECHNIQUE A multi-channel overnight portable sleep study was performed. The channels recorded were: nasal airflow, thoracic respiratory movement, and oxygen saturation with a pulse oximetry. Snoring was also monitored.  MEDICATIONS Patient self administered medications include: N/A.  SLEEP ARCHITECTURE Patient was studied for 455.9 minutes. The sleep efficiency was 95.0 % and the patient was supine for 0%. The arousal index was 0.3 per hour.  RESPIRATORY PARAMETERS The overall AHI was 63.6 per hour, with a central apnea index of 0 per hour.  The oxygen nadir was 75% during sleep.  CARDIAC DATA Mean heart rate during sleep was 81.1 bpm.  IMPRESSIONS - Severe obstructive sleep apnea occurred during this study (AHI = 63.6/h). - Severe oxygen desaturation was noted during this study (Min O2 = 75%). - Patient snored 0.6% during the sleep.  DIAGNOSIS - Obstructive Sleep Apnea (G47.33) - Nocturnal Hypoxemia (G47.36)  RECOMMENDATIONS - Additional therapies include CPAP, oral appliance, or surgical assessment. - Avoid alcohol, sedatives and other CNS depressants that may worsen sleep apnea and disrupt normal sleep architecture. - Sleep hygiene should be reviewed to assess factors that may improve sleep quality. - Weight management and regular exercise should be initiated or continued. - Sleep medicine consultation available as needed to assist with management.  [Electronically signed] 10/31/2022 09:46 AM  Coralyn Helling MD, ABSM Diplomate, American Board of Sleep  Medicine NPI: [id number]  [facility] PH: [phone]   FX: [phone] ACCREDITED BY THE AMERICAN ACADEMY OF SLEEP MEDICINE

## 2022-11-24 ENCOUNTER — Ambulatory Visit: Payer: Medicaid Other | Admitting: Nutrition

## 2022-11-26 ENCOUNTER — Institutional Professional Consult (permissible substitution): Payer: Medicaid Other | Admitting: Pulmonary Disease

## 2022-12-04 ENCOUNTER — Encounter: Payer: Self-pay | Admitting: Gastroenterology

## 2022-12-04 ENCOUNTER — Institutional Professional Consult (permissible substitution): Payer: Medicaid Other | Admitting: Primary Care

## 2023-01-02 ENCOUNTER — Institutional Professional Consult (permissible substitution): Payer: Medicaid Other | Admitting: Primary Care

## 2023-01-06 ENCOUNTER — Telehealth: Payer: Self-pay | Admitting: *Deleted

## 2023-01-06 ENCOUNTER — Encounter: Payer: Self-pay | Admitting: Gastroenterology

## 2023-01-06 ENCOUNTER — Ambulatory Visit (INDEPENDENT_AMBULATORY_CARE_PROVIDER_SITE_OTHER): Payer: Medicaid Other | Admitting: Gastroenterology

## 2023-01-06 VITALS — BP 125/83 | HR 77 | Temp 98.0°F | Ht 66.0 in | Wt 280.6 lb

## 2023-01-06 DIAGNOSIS — B9681 Helicobacter pylori [H. pylori] as the cause of diseases classified elsewhere: Secondary | ICD-10-CM | POA: Diagnosis not present

## 2023-01-06 DIAGNOSIS — K2970 Gastritis, unspecified, without bleeding: Secondary | ICD-10-CM | POA: Diagnosis not present

## 2023-01-06 DIAGNOSIS — R1012 Left upper quadrant pain: Secondary | ICD-10-CM | POA: Diagnosis not present

## 2023-01-06 DIAGNOSIS — R1032 Left lower quadrant pain: Secondary | ICD-10-CM | POA: Diagnosis not present

## 2023-01-06 MED ORDER — TRULANCE 3 MG PO TABS: 3.0000 mg | ORAL_TABLET | Freq: Every day | ORAL | 3 refills | Status: AC

## 2023-01-06 NOTE — Telephone Encounter (Signed)
RADmd PA for CT: Pending, Tracking # [id number]

## 2023-01-06 NOTE — Progress Notes (Signed)
GI Office Note    Referring Provider: Wille Glaser, PA-C Primary Care Physician:  Wille Glaser, PA-C  Primary Gastroenterologist: Hennie Duos. Marletta Lor, DO   Chief Complaint   Chief Complaint  Patient presents with   Abdominal Pain    Left side abdominal pain, bloated, sore to touch    History of Present Illness   Nina Sanchez is a 42 y.o. female presenting today for abdominal pain. Last seen in 08/2022 for GERD, rectal bleeding.   LUQ/LLQ/left flank pain, constant pain for 3 weeks. Started while laying in bed. Has been having to sleep propped up because when she gets out of bed it hurts. Does not recall any injury. Feeling bloating. Went to PCP last week. Xray and labs. ?constipation. Advised to take Metamucil gummies, miralax, water. Took magnesium citrate. Felt a little better for couple days. Now pain back. No dysuria. States her urine specimen was also normal last week. Chronic BM 2-3 times per week. No melena, brbpr. No heartburn on PPI. No change in swallowing.   Completed H.pylori treatment with PPI BID, tetracycline, metronidazole, bismuth.  Colonoscopy 10/2022: -non-bleeding internal hemorrhoids -anal papilla were hypertrophied s/p biopsy, bx showed hypertrophied anal papillae -Two 4-5 mm polyps in descending colon, bx showed hyperplastic polyps -next colonoscopy 10 years  EGD 10/2022: -H.pylori gastritis   Medications   Current Outpatient Medications  Medication Sig Dispense Refill   acetaminophen (TYLENOL) 500 MG tablet Take 500 mg by mouth every 6 (six) hours as needed.     ALPRAZolam (XANAX) 0.5 MG tablet Take 0.5 mg by mouth every 12 (twelve) hours as needed for anxiety.     desvenlafaxine (PRISTIQ) 50 MG 24 hr tablet Take 50 mg by mouth at bedtime.     lubiprostone (AMITIZA) 24 MCG capsule Take 1 capsule (24 mcg total) by mouth 2 (two) times daily with a meal. 60 capsule 5   metFORMIN (GLUCOPHAGE) 500 MG tablet Take 500 mg by mouth daily.      pantoprazole (PROTONIX) 40 MG tablet Take 1 tablet (40 mg total) by mouth 2 (two) times daily. 60 tablet 11   Vitamin D, Ergocalciferol, (DRISDOL) 1.25 MG (50000 UNIT) CAPS capsule Take 50,000 Units by mouth every Monday.     No current facility-administered medications for this visit.    Allergies   Allergies as of 01/06/2023 - Review Complete 01/06/2023  Allergen Reaction Noted   Hydrocodone Hives 11/08/2015   Prednisone Nausea And Vomiting 11/08/2015   Rocephin [ceftriaxone] Hives 11/08/2015   Oxycodone Itching 10/24/2011      Review of Systems   General: Negative for anorexia, weight loss, fever, chills, fatigue, weakness. ENT: Negative for hoarseness, difficulty swallowing , nasal congestion. CV: Negative for chest pain, angina, palpitations, dyspnea on exertion, peripheral edema.  Respiratory: Negative for dyspnea at rest, dyspnea on exertion, cough, sputum, wheezing.  GI: See history of present illness. GU:  Negative for dysuria, hematuria, urinary incontinence, urinary frequency, nocturnal urination.  Endo: Negative for unusual weight change.     Physical Exam   BP 125/83 (BP Location: Right Arm, Patient Position: Sitting, Cuff Size: Large)   Pulse 77   Temp 98 F (36.7 C) (Oral)   Ht 5\' 6"  (1.676 m)   Wt 280 lb 9.6 oz (127.3 kg)   LMP 12/24/2022 (Approximate)   SpO2 97%   BMI 45.29 kg/m    General: Well-nourished, well-developed in no acute distress.  Eyes: No icterus. Mouth: Oropharyngeal mucosa moist and pink   Lungs:  Clear to auscultation bilaterally.  Heart: Regular rate and rhythm, no murmurs rubs or gallops.  Abdomen: Bowel sounds are normal,  nondistended, no hepatosplenomegaly or masses,  no abdominal bruits or hernia , no rebound or guarding. No cva tenderness. Tenderness noted in LLQ/LUQ/left flank with palpation Rectal: not performed  Extremities: No lower extremity edema. No clubbing or deformities. Neuro: Alert and oriented x 4   Skin: Warm and  dry, no jaundice.   Psych: Alert and cooperative, normal mood and affect.  Labs   Requested recent labs   Imaging Studies   SLEEP STUDY DOCUMENTS  Result Date: 12/15/2022 Ordered by an unspecified provider.   Assessment   Abdominal pain: Symptoms constant for 3 weeks.  Pain mostly in the left upper quadrant/left flank area but radiates down into the left lower quadrant.  Worse with movement.  Potentially improved after laxatives.  Chronically at baseline has constipation typically having a bowel movement 2-3 times per week.  Recent EGD and colonoscopy reassuring.  Patient reports recent plain abdominal film negative.  Labs unremarkable.  Urinalysis unremarkable.  We have requested these records.  Differential diagnosis quite broad including GYN source, constipation, pancreatitis, kidney stone, colitis less likely.  No notable diverticulosis on colonoscopy making diverticulitis less likely.  Musculoskeletal remains in the differential.  Bloating: Likely multifactorial.  Encouraged her to cut back on carbonated beverages.  Will optimize bowel function.  CT scan planned.  H. pylori gastritis: Need to confirm eradication.   PLAN   Stop lubiprostone and start Trulance 3 mg daily.  Prescription sent to her pharmacy.  Samples provided. Continue pantoprazole 40 mg twice daily before a meal. CT abdomen and pelvis with contrast. Retrieve records from PCP regarding recent labs and x-ray. H. pylori stool antigen planned in the future once she is ready.  She knows that she will need to be off of PPI and antibiotics for 2 weeks prior to stool collection.   Leanna Battles. Melvyn Neth, MHS, PA-C [facility]

## 2023-01-06 NOTE — Patient Instructions (Signed)
Stop lubiprostone and start Trulance 3mg  daily. I have sent in RX but also gave samples to try. Continue pantoprazole 40mg  twice daily before a meal. CT scan to be scheduled. I will review records from recent work up with PCP. When you are ready, please collect stool specimen to make sure H.pylori bug adequately treated. You will have to stop acid reflux medication like pantoprazole and no antibiotics for TWO WEEKS before stool collection. You can use pepcid during that time if needed for reflux symptoms. Lab order provided today.

## 2023-01-09 NOTE — Telephone Encounter (Signed)
PA's still in review

## 2023-01-12 NOTE — Telephone Encounter (Signed)
PA's still pending

## 2023-01-14 ENCOUNTER — Ambulatory Visit: Payer: Medicaid Other | Admitting: Nutrition

## 2023-01-16 NOTE — Telephone Encounter (Signed)
PA's still pending

## 2023-01-19 NOTE — Telephone Encounter (Signed)
Pt's insurance denied PA for CT A/P. They need results of  initial imaging (like sound wave (ultrasound), x-ray with or without dye (barium) or scope tests). Results of those tests should show why this test is still needed or you can do peer to peer. Please advise. Thank you

## 2023-01-22 ENCOUNTER — Institutional Professional Consult (permissible substitution): Payer: Medicaid Other | Admitting: Nurse Practitioner

## 2023-01-22 ENCOUNTER — Encounter: Payer: Self-pay | Admitting: Nurse Practitioner

## 2023-01-26 NOTE — Telephone Encounter (Signed)
Can we find out where she had abd xray? I tried to get a copy from her PCP at [facility] but none sent. If we have this available, we could potentially use to get CT approved.

## 2023-01-27 NOTE — Telephone Encounter (Signed)
I have sent another request to [facility] requesting any recent Xray, imaging results and noted that patient said she had it done at [facility] and to fax it ASAP.

## 2023-01-27 NOTE — Telephone Encounter (Signed)
Pt says she had x-ray done at [facility].

## 2023-01-29 NOTE — Telephone Encounter (Signed)
Tammy R, you submitted xray report?

## 2023-01-29 NOTE — Telephone Encounter (Signed)
[facility] records/dg abd 2v Received: 2 days ago Claudie Leach, LPN The records you were needing from [facility] on this patient is in epic now.

## 2023-01-29 NOTE — Telephone Encounter (Signed)
RadMD PA for CT: Tracking:[id number] Status: In Review

## 2023-01-30 NOTE — Telephone Encounter (Signed)
Yes ma'am! 

## 2023-01-30 NOTE — Telephone Encounter (Signed)
thanks

## 2023-02-03 ENCOUNTER — Telehealth: Payer: Self-pay | Admitting: *Deleted

## 2023-02-03 NOTE — Telephone Encounter (Signed)
Pt left vm returning call  LMTRC 

## 2023-02-03 NOTE — Telephone Encounter (Signed)
[facility]  CT is scheduled for Friday 02/20/23, arrive at 9:15 am to check in at [facility].

## 2023-02-05 ENCOUNTER — Encounter: Payer: Self-pay | Admitting: *Deleted

## 2023-02-05 NOTE — Telephone Encounter (Signed)
Sent Message via MyChart to pt about upcoming CT appointment

## 2023-02-12 NOTE — Telephone Encounter (Signed)
Pt informed of CT appointment date, time and instructions. Verbalized understanding.

## 2023-02-20 ENCOUNTER — Ambulatory Visit (HOSPITAL_COMMUNITY): Payer: Medicaid Other

## 2023-03-17 ENCOUNTER — Ambulatory Visit: Payer: Medicaid Other | Admitting: Nutrition

## 2023-04-28 ENCOUNTER — Telehealth: Payer: Self-pay | Admitting: Gastroenterology

## 2023-04-28 NOTE — Telephone Encounter (Signed)
Labs from June 2024: Creatinine 0.6, total bilirubin 0.2, alkaline phosphatase 83, AST 23, ALT 29, lipase 25 December 2022 abdominal 2 view: Negative.

## 2023-05-04 ENCOUNTER — Ambulatory Visit: Payer: Medicaid Other | Admitting: Nutrition

## 2023-05-27 ENCOUNTER — Ambulatory Visit: Payer: Medicaid Other | Admitting: Nutrition

## 2023-07-24 ENCOUNTER — Ambulatory Visit: Payer: Medicaid Other | Admitting: Gastroenterology

## 2023-07-24 ENCOUNTER — Encounter: Payer: Self-pay | Admitting: Gastroenterology

## 2023-07-24 VITALS — BP 131/84 | HR 96 | Temp 98.0°F | Ht 66.0 in | Wt 265.0 lb

## 2023-07-24 DIAGNOSIS — B9681 Helicobacter pylori [H. pylori] as the cause of diseases classified elsewhere: Secondary | ICD-10-CM

## 2023-07-24 DIAGNOSIS — J351 Hypertrophy of tonsils: Secondary | ICD-10-CM

## 2023-07-24 DIAGNOSIS — R1310 Dysphagia, unspecified: Secondary | ICD-10-CM | POA: Insufficient documentation

## 2023-07-24 DIAGNOSIS — J039 Acute tonsillitis, unspecified: Secondary | ICD-10-CM | POA: Insufficient documentation

## 2023-07-24 DIAGNOSIS — K2970 Gastritis, unspecified, without bleeding: Secondary | ICD-10-CM

## 2023-07-24 DIAGNOSIS — K219 Gastro-esophageal reflux disease without esophagitis: Secondary | ICD-10-CM

## 2023-07-24 MED ORDER — FLUCONAZOLE 150 MG PO TABS: ORAL_TABLET | ORAL | 0 refills | Status: AC

## 2023-07-24 MED ORDER — AZITHROMYCIN 250 MG PO TABS: ORAL_TABLET | ORAL | 0 refills | Status: AC

## 2023-07-24 NOTE — Patient Instructions (Addendum)
Take pantoprazole 40mg  before breakfast and supper.  Take famotidine 20mg  at lunch and bedtime. Start azithromycin, take 2 on day 1, then 1 daily for four days. Diflucan provided for yeast infection related to antibiotics. You should take first on on the day two of your azithromycin.  If persistent throat symptoms, then we can offer barium xray of your esophagus to make sure nothing is going on there but I suspect your symptoms are coming from your throat. You may need to see ENT if ongoing symptoms.  Once you are feeling better, please complete H.pylori stool test to make sure we got rid of the bug in your stomach that caused gastritis before. This bug has been linked to cancer so we need to make sure it is treated completely. You will need to be off antibiotics, pantoprazole, pepto for two weeks before collection stool. You can use famotidine/pepcid during this time to control your reflux.

## 2023-07-24 NOTE — Progress Notes (Signed)
GI Office Note    Referring Provider: Lovey Newcomer, PA Primary Care Physician:  Lovey Newcomer, PA  Primary Gastroenterologist: Hennie Duos. Marletta Lor, DO   Chief Complaint   Chief Complaint  Patient presents with   Dysphagia    States that it feels like there is a knot on the left side of her throat that is causing her to have trouble swallowing, has noticed for about a week    History of Present Illness   Nina Sanchez is a 43 y.o. female presenting today for follow up. Last seen 01/2023. Presents today at request of [name] PA-C for dysphagia.   Patient did not complete CT or H.pylori stool antigen after last ov. States she was unable to come off PPI long enough to collect stool  Today:  For one week she has had discomfort on left side of throat. Hurts to swallowing. No dysphagia. Denies cough, congestion. She has been using wood stove more in the house since the colder weather. No heartburn or indigestion right now, on pantoprazole twice a day. Was given famotidine 20mg  bid yesterday by PCP. Offered steroid for inflammation in the back of her throat but she states she gets N/V and can't take it. She has used some pepcid otc for breakthrough heartburn symptoms, sometimes taking a third pantoprazole. Uses [employer] powders sometimes for headache if topamax does not help. No bowel concerns.   07/2023: Hgb 10.9, Hct 33.2 (normal), Platelets 312, TSH 2.854, WBC 8.5, Tbili 0.2, AP 78, AST 18, ALT 23   Colonoscopy 10/2022: -non-bleeding internal hemorrhoids -anal papilla were hypertrophied s/p biopsy, bx showed hypertrophied anal papillae -Two 4-5 mm polyps in descending colon, bx showed hyperplastic polyps -next colonoscopy 10 years   EGD 10/2022: -H.pylori gastritis  Medications   Current Outpatient Medications  Medication Sig Dispense Refill   acetaminophen (TYLENOL) 500 MG tablet Take 500 mg by mouth every 6 (six) hours as needed.     ALPRAZolam (XANAX) 0.5 MG  tablet Take 0.5 mg by mouth every 12 (twelve) hours as needed for anxiety.     azithromycin (ZITHROMAX Z-PAK) 250 MG tablet Take 2 tablets on day 1, then 1 tablet on days 2-5. 6 each 0   desvenlafaxine (PRISTIQ) 100 MG 24 hr tablet Take 100 mg by mouth daily.     famotidine (PEPCID) 20 MG tablet Take 20 mg by mouth 2 (two) times daily.     fluconazole (DIFLUCAN) 150 MG tablet Take one tablet, then repeat in 72 hours. 2 tablet 0   pantoprazole (PROTONIX) 40 MG tablet Take 1 tablet (40 mg total) by mouth 2 (two) times daily. 60 tablet 11   Plecanatide (TRULANCE) 3 MG TABS Take 1 tablet (3 mg total) by mouth daily. 30 tablet 3   topiramate ER (QUDEXY XR) 150 MG CS24 sprinkle capsule SMARTSIG:1 Capsule(s) By Mouth Every Evening     No current facility-administered medications for this visit.    Allergies   Allergies as of 07/24/2023 - Review Complete 07/24/2023  Allergen Reaction Noted   Hydrocodone Hives 11/08/2015   Prednisone Nausea And Vomiting 11/08/2015   Rocephin [ceftriaxone] Hives 11/08/2015   Oxycodone Itching 10/24/2011       Review of Systems   General: Negative for anorexia, weight loss, fever, chills, fatigue, weakness. ENT: Negative for hoarseness, difficulty swallowing , nasal congestion. See hpi CV: Negative for chest pain, angina, palpitations, dyspnea on exertion, peripheral edema.  Respiratory: Negative for dyspnea at rest, dyspnea on  exertion, cough, sputum, wheezing.  GI: See history of present illness. GU:  Negative for dysuria, hematuria, urinary incontinence, urinary frequency, nocturnal urination.  Endo: Negative for unusual weight change.     Physical Exam   BP 131/84 (BP Location: Right Arm, Patient Position: Sitting, Cuff Size: Large)   Pulse 96   Temp 98 F (36.7 C) (Oral)   Ht 5\' 6"  (1.676 m)   Wt 265 lb (120.2 kg)   LMP  (LMP Unknown)   SpO2 99%   BMI 42.77 kg/m    General: Well-nourished, well-developed in no acute distress.  Eyes: No  icterus. Mouth: Oropharyngeal mucosa moist and pink, erythema in the posterior pharynx, bilateral but more on the right. Enlarged tonsils right side worse then left, with extension into midline Abdomen: Bowel sounds are normal, nontender, nondistended, no hepatosplenomegaly or masses,  no abdominal bruits or hernia , no rebound or guarding.  Rectal: not performed  Extremities: No lower extremity edema. No clubbing or deformities. Neuro: Alert and oriented x 4   Skin: Warm and dry, no jaundice.   Psych: Alert and cooperative, normal mood and affect.  Labs   See hpi  Imaging Studies   No results found.  Assessment/Plan:   Odynophagia: notes one week history on left throat area with swallowing. Denies episode of pill dysphagia or getting food stuck. Only change is use of wood stove with fan more so recently. No ill contacts. On exam she has posterior pharyngeal erythema with right tonsil significant bigger than the left, crossing midline. Doubt her symptoms are coming from her esophagus or reflux but agree with optimizing reflux medications for now.  Suspect tonsillitis causing her symptoms. -continue pantoprazole 40mg  before breakfast and evening meal -Agree with adding famotidine 20 mg at lunch and bedtime -Azithromycin 500 mg day 1, 250 mg days 2 through 5.  Provided Diflucan as she reports that she typically gets yeast infections with antibiotics. -If her symptoms do not subside, we can offer her barium pill esophagram to look at her esophagus versus ENT evaluation.  She will let me know how she does.  H. pylori gastritis: -Stool antigen advised again to confirm eradication.  Explained to patient that H. pylori has been considered carcinogenic and can lead to gastric cancer. -Collect stool for H. pylori stool antigen once she is able to be off antibiotics and PPI for 2 weeks.  She can use H2 blockers, Rolaids, Tums if needed to control reflux during this time.       Leanna Battles.  Melvyn Neth, MHS, PA-C [facility]

## 2024-07-07 ENCOUNTER — Encounter: Payer: Self-pay | Admitting: Gastroenterology
